# Patient Record
Sex: Male | Born: 1935
Health system: Southern US, Community
[De-identification: ages and names within clinical notes are randomized; demographics above are authoritative.]

## PROBLEM LIST (undated history)

## (undated) DIAGNOSIS — I10 Essential (primary) hypertension: Secondary | ICD-10-CM

## (undated) DIAGNOSIS — N400 Enlarged prostate without lower urinary tract symptoms: Secondary | ICD-10-CM

## (undated) DIAGNOSIS — R9431 Abnormal electrocardiogram [ECG] [EKG]: Secondary | ICD-10-CM

## (undated) DIAGNOSIS — K649 Unspecified hemorrhoids: Secondary | ICD-10-CM

## (undated) DIAGNOSIS — N471 Phimosis: Secondary | ICD-10-CM

## (undated) DIAGNOSIS — N529 Male erectile dysfunction, unspecified: Secondary | ICD-10-CM

## (undated) DIAGNOSIS — E559 Vitamin D deficiency, unspecified: Secondary | ICD-10-CM

## (undated) DIAGNOSIS — K635 Polyp of colon: Secondary | ICD-10-CM

## (undated) DIAGNOSIS — E039 Hypothyroidism, unspecified: Secondary | ICD-10-CM

## (undated) DIAGNOSIS — H409 Unspecified glaucoma: Secondary | ICD-10-CM

## (undated) DIAGNOSIS — E785 Hyperlipidemia, unspecified: Secondary | ICD-10-CM

## (undated) DIAGNOSIS — B029 Zoster without complications: Secondary | ICD-10-CM

## (undated) DIAGNOSIS — R7301 Impaired fasting glucose: Secondary | ICD-10-CM

## (undated) HISTORY — DX: Polyp of colon: K63.5

## (undated) HISTORY — PX: TONSILLECTOMY: SUR1361

## (undated) HISTORY — DX: Benign prostatic hyperplasia without lower urinary tract symptoms: N40.0

## (undated) HISTORY — PX: EYE SURGERY: SHX253

## (undated) HISTORY — DX: Essential (primary) hypertension: I10

## (undated) HISTORY — DX: Hypothyroidism, unspecified: E03.9

## (undated) HISTORY — DX: Zoster without complications: B02.9

## (undated) HISTORY — DX: Vitamin D deficiency, unspecified: E55.9

## (undated) HISTORY — DX: Impaired fasting glucose: R73.01

## (undated) HISTORY — DX: Unspecified hemorrhoids: K64.9

## (undated) HISTORY — DX: Hyperlipidemia, unspecified: E78.5

## (undated) HISTORY — DX: Male erectile dysfunction, unspecified: N52.9

---

## 1997-02-23 DIAGNOSIS — I10 Essential (primary) hypertension: Secondary | ICD-10-CM

## 1997-02-23 HISTORY — DX: Essential (primary) hypertension: I10

## 1999-02-24 DIAGNOSIS — E785 Hyperlipidemia, unspecified: Secondary | ICD-10-CM

## 1999-02-24 HISTORY — DX: Hyperlipidemia, unspecified: E78.5

## 2001-01-28 ENCOUNTER — Ambulatory Visit (HOSPITAL_COMMUNITY): Admission: RE | Admit: 2001-01-28 | Discharge: 2001-01-28 | Payer: Self-pay | Admitting: *Deleted

## 2001-01-28 ENCOUNTER — Encounter (INDEPENDENT_AMBULATORY_CARE_PROVIDER_SITE_OTHER): Payer: Self-pay | Admitting: Specialist

## 2003-02-24 DIAGNOSIS — R7301 Impaired fasting glucose: Secondary | ICD-10-CM

## 2003-02-24 HISTORY — DX: Impaired fasting glucose: R73.01

## 2004-02-24 DIAGNOSIS — B029 Zoster without complications: Secondary | ICD-10-CM

## 2004-02-24 DIAGNOSIS — K649 Unspecified hemorrhoids: Secondary | ICD-10-CM

## 2004-02-24 HISTORY — DX: Zoster without complications: B02.9

## 2004-02-24 HISTORY — DX: Unspecified hemorrhoids: K64.9

## 2007-01-28 ENCOUNTER — Encounter (INDEPENDENT_AMBULATORY_CARE_PROVIDER_SITE_OTHER): Payer: Self-pay | Admitting: Surgery

## 2007-01-28 ENCOUNTER — Ambulatory Visit (HOSPITAL_BASED_OUTPATIENT_CLINIC_OR_DEPARTMENT_OTHER): Admission: RE | Admit: 2007-01-28 | Discharge: 2007-01-28 | Payer: Self-pay | Admitting: Surgery

## 2009-08-24 ENCOUNTER — Emergency Department (HOSPITAL_COMMUNITY): Admission: EM | Admit: 2009-08-24 | Discharge: 2009-08-25 | Payer: Self-pay | Admitting: Emergency Medicine

## 2009-08-24 ENCOUNTER — Emergency Department (HOSPITAL_COMMUNITY): Admission: EM | Admit: 2009-08-24 | Discharge: 2009-08-24 | Payer: Self-pay | Admitting: Family Medicine

## 2010-05-08 ENCOUNTER — Other Ambulatory Visit: Payer: Self-pay | Admitting: Gastroenterology

## 2010-05-11 LAB — URINE CULTURE: Colony Count: NO GROWTH

## 2010-05-11 LAB — DIFFERENTIAL
Basophils Absolute: 0 10*3/uL (ref 0.0–0.1)
Lymphs Abs: 0.7 10*3/uL (ref 0.7–4.0)
Monocytes Absolute: 0.4 10*3/uL (ref 0.1–1.0)
Monocytes Relative: 11 % (ref 3–12)
Neutro Abs: 2.9 10*3/uL (ref 1.7–7.7)
Neutrophils Relative %: 72 % (ref 43–77)

## 2010-05-11 LAB — POCT URINALYSIS DIP (DEVICE)
Glucose, UA: NEGATIVE mg/dL
Nitrite: NEGATIVE
Protein, ur: 100 mg/dL — AB
Specific Gravity, Urine: 1.025 (ref 1.005–1.030)
Urobilinogen, UA: 1 mg/dL (ref 0.0–1.0)
pH: 5.5 (ref 5.0–8.0)

## 2010-05-11 LAB — GLUCOSE, CAPILLARY: Glucose-Capillary: 122 mg/dL — ABNORMAL HIGH (ref 70–99)

## 2010-05-11 LAB — BASIC METABOLIC PANEL
BUN: 30 mg/dL — ABNORMAL HIGH (ref 6–23)
Calcium: 9 mg/dL (ref 8.4–10.5)
GFR calc non Af Amer: 51 mL/min — ABNORMAL LOW (ref >60)
Potassium: 2.9 mEq/L — ABNORMAL LOW (ref 3.5–5.1)
Sodium: 137 mEq/L (ref 135–145)

## 2010-05-11 LAB — URINALYSIS, ROUTINE W REFLEX MICROSCOPIC
Glucose, UA: NEGATIVE mg/dL
Leukocytes, UA: NEGATIVE

## 2010-05-11 LAB — CULTURE, BLOOD (ROUTINE X 2)
Culture: NO GROWTH
Culture: NO GROWTH

## 2010-05-11 LAB — CBC
HCT: 43.3 % (ref 39.0–52.0)
MCH: 32 pg (ref 26.0–34.0)
RBC: 4.61 MIL/uL (ref 4.22–5.81)
RDW: 13.5 % (ref 11.5–15.5)
WBC: 4 10*3/uL (ref 4.0–10.5)

## 2010-05-11 LAB — LACTIC ACID, PLASMA: Lactic Acid, Venous: 1.1 mmol/L (ref 0.5–2.2)

## 2010-05-11 LAB — URINE MICROSCOPIC-ADD ON

## 2010-07-08 NOTE — Op Note (Signed)
Anthony Allison, Anthony Allison            ACCOUNT NO.:  [id number]   MEDICAL RECORD NO.:  [id number]          PATIENT TYPE:  AMB   LOCATION:  [facility]                         FACILITY:  [facility]   PHYSICIAN:  Anthony Arms. Corliss Skains, M.D. DATE OF BIRTH:  May 21, 1935   DATE OF PROCEDURE:  01/28/2007  DATE OF DISCHARGE:                               OPERATIVE REPORT   PREOPERATIVE DIAGNOSES:  Prolapsing internal hemorrhoids.   POSTOPERATIVE DIAGNOSES:  Prolapsing internal hemorrhoids.   PROCEDURE PERFORMED:  Stapled hemorrhoidopexy (PPH).   SURGEON:  Anthony Arms. Corliss Skains, M.D., FACS   ANESTHESIA:  General endotracheal.   INDICATIONS:  The patient is a 75 year old male who presents with a  several year history of hemorrhoids which seemed to be prolapsing.  He  occasionally sees some bleeding.  He has to manually reduce these  hemorrhoids.  His bowel movements have improved slightly with the use of  stool softeners.  However, he continues to have prolapse.  He presents  today for a PPH procedure.   DESCRIPTION OF PROCEDURE:  The patient was brought to the operating  room, placed in supine position on a stretcher.  After an adequate level  of general anesthesia was obtained, the patient was flipped to a prone  position with appropriate padding.  He was placed in jackknife position.  His buttocks were taped apart.  His perineum was prepped with Betadine  and draped in a sterile fashion.  A lubricated finger was used to  serially dilate his anus up to three fingers.  The [facility] kit was opened.  The white dilator was slowly inserted into the anal canal and held for  several minutes.  This was then removed and the dilator was used to  insert the white working retractor.  The working retractor was sutured  into place with two interrupted 2-0 nylon sutures.  The dilator was  removed and the white sewing retractor was inserted.  A pursestring  suture of 2-0 Prolene was used to create a pursestring suture at the and  of the white sewing retractor.  We created a circumferential  pursestring.  We confirmed this by pulling the pursestring tight around  my finger.  The sewing retractor was then removed.  The PPH stapler was  opened to its longest extent and the green anvil was inserted above the  pursestring.  The pursestring was then tied down around the anvil.  The  sutures were passed through the side ports of the stapler and an air  knot was tied.  Gentle retraction was placed on this air knot as the  stapler was closed.  The staple line appeared to be about 3 cm above the  dentate line.  We held the stapler in a closed position for about 1  minute.  We then fired the stapler.  An entire ring of hemorrhoid tissue  was seen.  This was removed from the stapler and sent for pathologic  examination.  We then inspected the staple line.  There was minimal  oozing.  This was controlled with direct pressure.  We irrigated the  rectum thoroughly.  No further bleeding was  noted. A large piece of  Gelfoam was rolled up and inserted into the  anal canal.  The retractors were all removed and an ABD dressing was  placed over the anus.  The patient was then rolled back to a supine  position, was extubated and brought to recovery in stable condition.  All sponge, instrument, and needle counts were correct.      Anthony Allison, M.D.  Electronically Signed     MKT/MEDQ  D:  01/28/2007  T:  01/28/2007  Job:  [id number]   cc:   Loraine Leriche A. Perini, M.D.  Fax: [phone]

## 2010-12-01 LAB — DIFFERENTIAL
Basophils Absolute: 0
Basophils Relative: 1
Eosinophils Absolute: 0.1 — ABNORMAL LOW
Lymphs Abs: 1.7
Monocytes Absolute: 0.6
Neutrophils Relative %: 57

## 2010-12-01 LAB — BASIC METABOLIC PANEL
BUN: 17
CO2: 31
Creatinine, Ser: 1.04
GFR calc non Af Amer: >60
Glucose, Bld: 97
Sodium: 140

## 2010-12-01 LAB — CBC
HCT: 46.1
Platelets: 147 — ABNORMAL LOW
RBC: 4.98
RDW: 13.1
WBC: 5.5

## 2011-06-30 IMAGING — CR DG CHEST 2V
2 series · 2 of 2 positions shown · non-contrast
Comparison: None.

CLINICAL DATA: Fever; history of dizziness.

CHEST - 2 VIEW

[w chest pa]
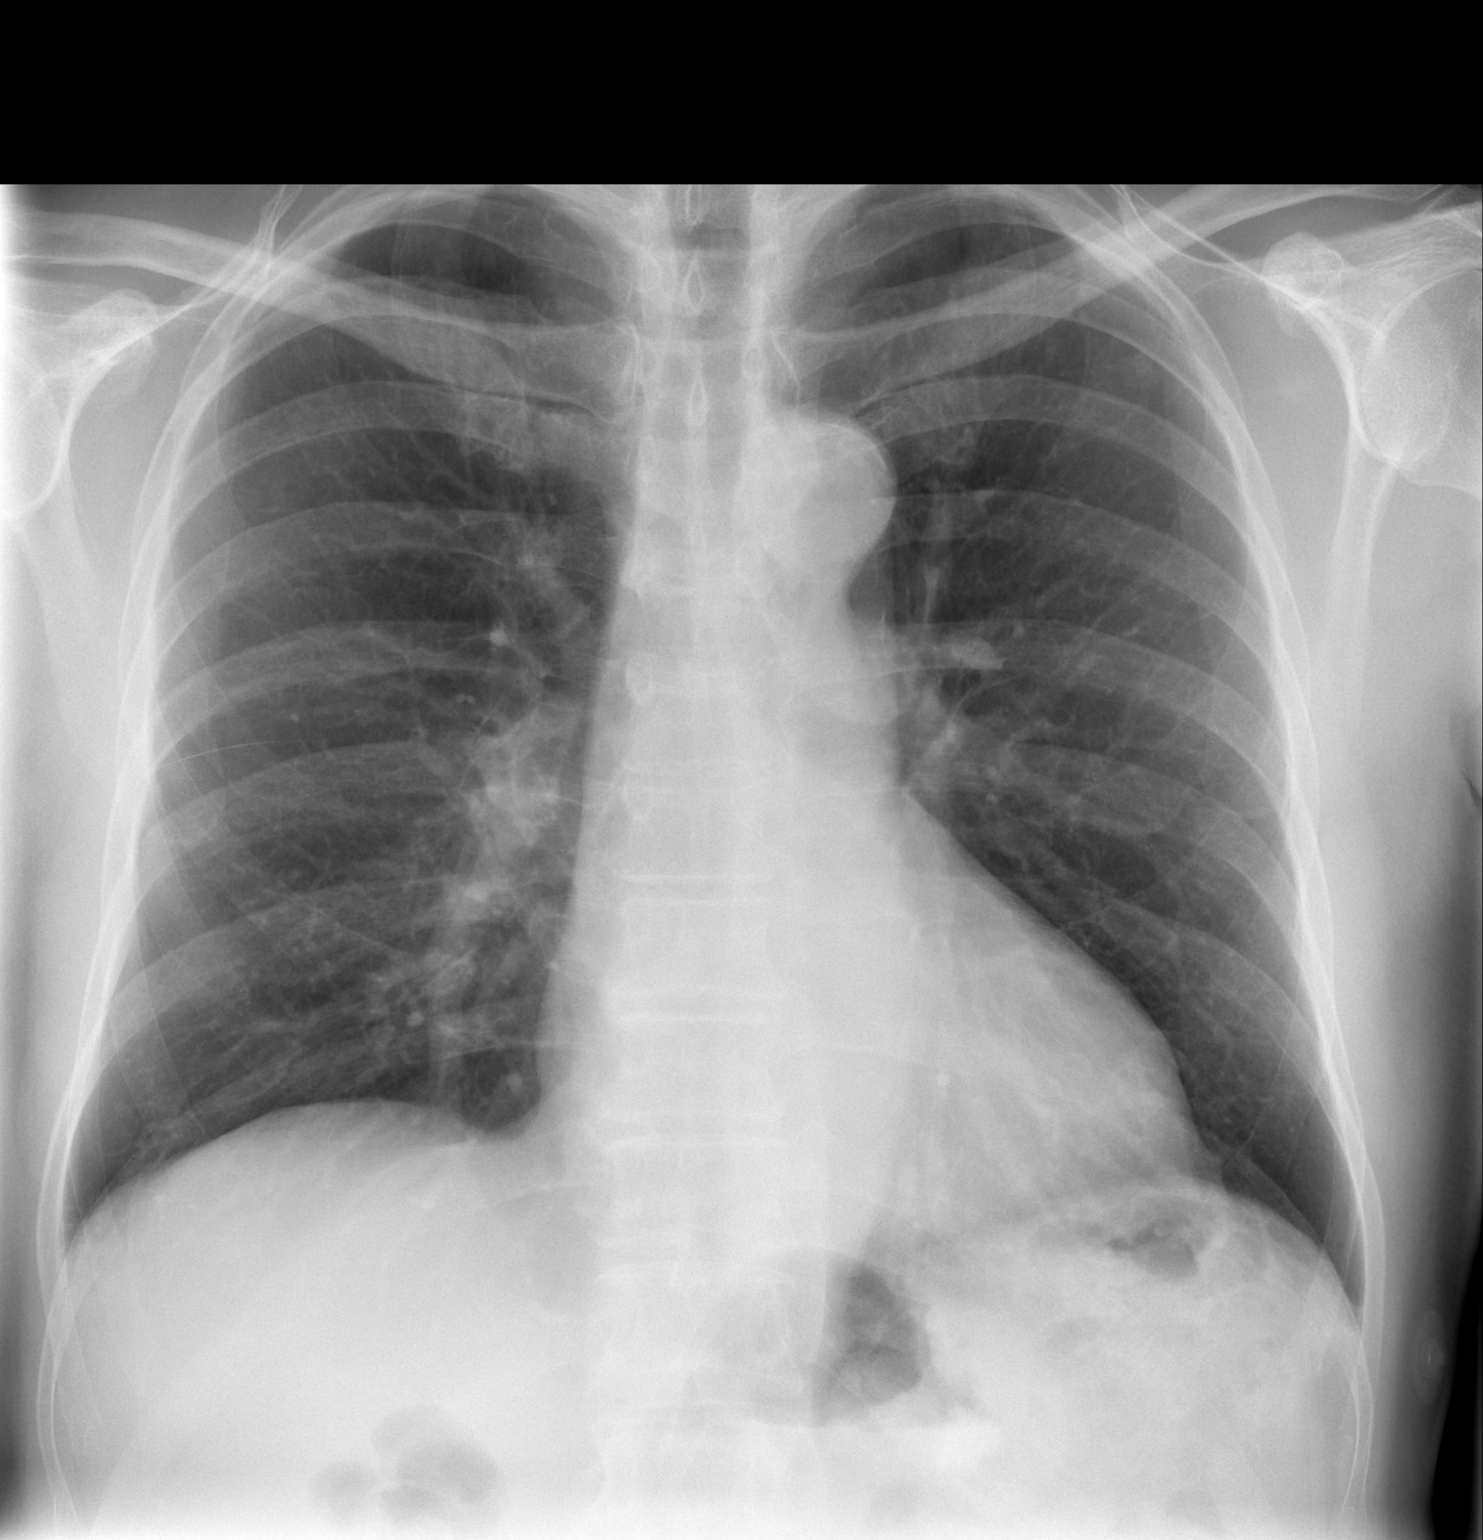

[w chest lat]
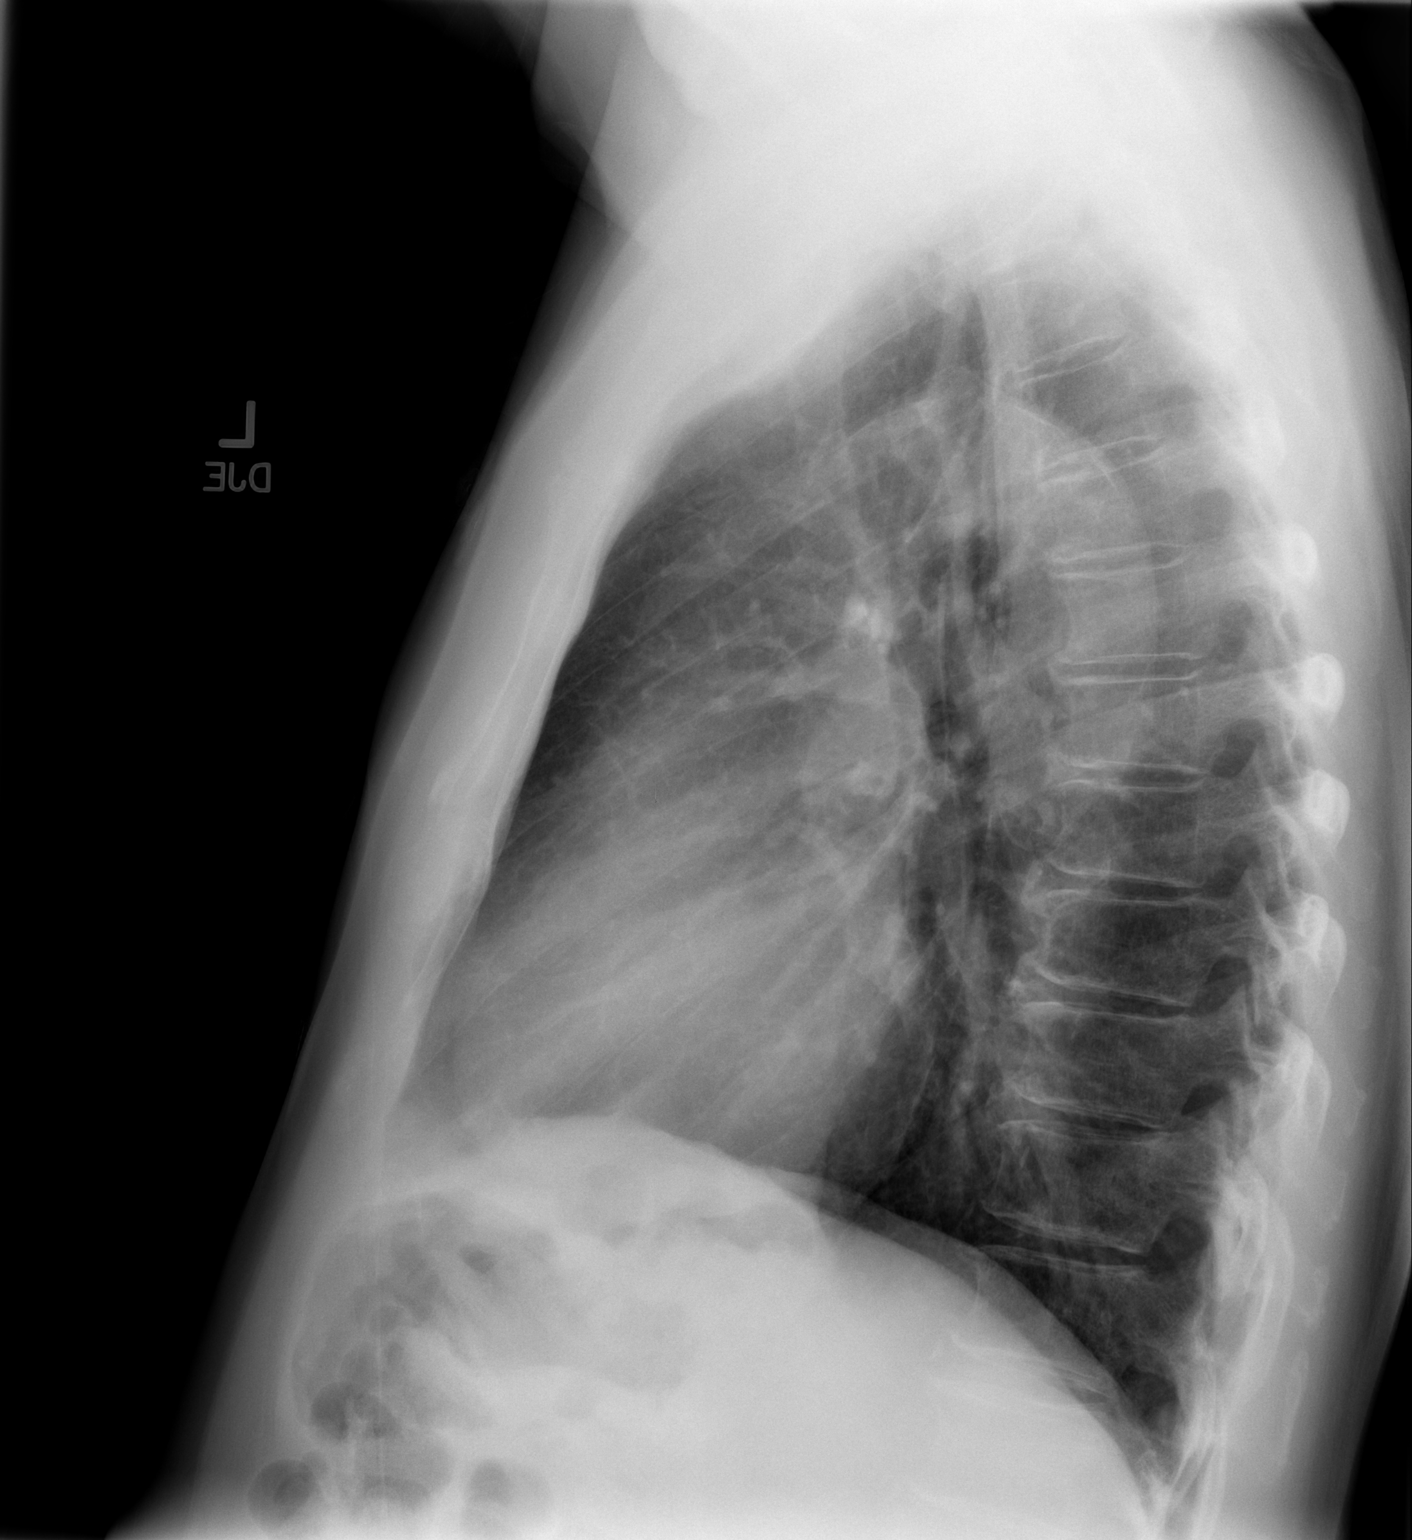

[2 of 2 positions shown; findings below may reference images not displayed]

FINDINGS: The lungs are well-aerated and clear.  There is no
evidence of focal opacification, pleural effusion or pneumothorax.

The heart is normal in size; calcification is noted within the
aortic arch.  No acute osseous abnormalities are seen.
IMPRESSION: No acute cardiopulmonary process seen.

## 2013-02-23 HISTORY — PX: GLAUCOMA SURGERY: SHX656

## 2014-03-13 DIAGNOSIS — H25012 Cortical age-related cataract, left eye: Secondary | ICD-10-CM | POA: Diagnosis not present

## 2014-03-13 DIAGNOSIS — H52202 Unspecified astigmatism, left eye: Secondary | ICD-10-CM | POA: Diagnosis not present

## 2014-03-13 DIAGNOSIS — H25812 Combined forms of age-related cataract, left eye: Secondary | ICD-10-CM | POA: Diagnosis not present

## 2014-03-13 DIAGNOSIS — H4010X0 Unspecified open-angle glaucoma, stage unspecified: Secondary | ICD-10-CM | POA: Diagnosis not present

## 2014-03-13 DIAGNOSIS — H2512 Age-related nuclear cataract, left eye: Secondary | ICD-10-CM | POA: Diagnosis not present

## 2014-03-13 DIAGNOSIS — H25042 Posterior subcapsular polar age-related cataract, left eye: Secondary | ICD-10-CM | POA: Diagnosis not present

## 2014-04-03 DIAGNOSIS — Z961 Presence of intraocular lens: Secondary | ICD-10-CM | POA: Diagnosis not present

## 2014-07-06 DIAGNOSIS — H401331 Pigmentary glaucoma, bilateral, mild stage: Secondary | ICD-10-CM | POA: Diagnosis not present

## 2014-07-06 DIAGNOSIS — Z961 Presence of intraocular lens: Secondary | ICD-10-CM | POA: Diagnosis not present

## 2014-07-10 DIAGNOSIS — Z85828 Personal history of other malignant neoplasm of skin: Secondary | ICD-10-CM | POA: Diagnosis not present

## 2014-07-10 DIAGNOSIS — L820 Inflamed seborrheic keratosis: Secondary | ICD-10-CM | POA: Diagnosis not present

## 2014-08-15 DIAGNOSIS — E785 Hyperlipidemia, unspecified: Secondary | ICD-10-CM | POA: Diagnosis not present

## 2014-08-15 DIAGNOSIS — E119 Type 2 diabetes mellitus without complications: Secondary | ICD-10-CM | POA: Diagnosis not present

## 2014-08-15 DIAGNOSIS — E559 Vitamin D deficiency, unspecified: Secondary | ICD-10-CM | POA: Diagnosis not present

## 2014-08-16 DIAGNOSIS — Z125 Encounter for screening for malignant neoplasm of prostate: Secondary | ICD-10-CM | POA: Diagnosis not present

## 2014-08-20 DIAGNOSIS — K635 Polyp of colon: Secondary | ICD-10-CM | POA: Diagnosis not present

## 2014-08-20 DIAGNOSIS — N529 Male erectile dysfunction, unspecified: Secondary | ICD-10-CM | POA: Diagnosis not present

## 2014-08-20 DIAGNOSIS — E119 Type 2 diabetes mellitus without complications: Secondary | ICD-10-CM | POA: Diagnosis not present

## 2014-08-20 DIAGNOSIS — N3281 Overactive bladder: Secondary | ICD-10-CM | POA: Diagnosis not present

## 2014-08-20 DIAGNOSIS — E785 Hyperlipidemia, unspecified: Secondary | ICD-10-CM | POA: Diagnosis not present

## 2014-08-20 DIAGNOSIS — E291 Testicular hypofunction: Secondary | ICD-10-CM | POA: Diagnosis not present

## 2014-08-20 DIAGNOSIS — E876 Hypokalemia: Secondary | ICD-10-CM | POA: Diagnosis not present

## 2014-08-20 DIAGNOSIS — Z0000 Encounter for general adult medical examination without abnormal findings: Secondary | ICD-10-CM | POA: Diagnosis not present

## 2014-08-23 DIAGNOSIS — Z961 Presence of intraocular lens: Secondary | ICD-10-CM | POA: Diagnosis not present

## 2014-08-23 DIAGNOSIS — H5340 Unspecified visual field defects: Secondary | ICD-10-CM | POA: Diagnosis not present

## 2014-08-23 DIAGNOSIS — H401331 Pigmentary glaucoma, bilateral, mild stage: Secondary | ICD-10-CM | POA: Diagnosis not present

## 2014-09-05 DIAGNOSIS — Z1212 Encounter for screening for malignant neoplasm of rectum: Secondary | ICD-10-CM | POA: Diagnosis not present

## 2014-12-06 DIAGNOSIS — Z23 Encounter for immunization: Secondary | ICD-10-CM | POA: Diagnosis not present

## 2014-12-06 DIAGNOSIS — E876 Hypokalemia: Secondary | ICD-10-CM | POA: Diagnosis not present

## 2015-01-25 DIAGNOSIS — Z961 Presence of intraocular lens: Secondary | ICD-10-CM | POA: Diagnosis not present

## 2015-01-25 DIAGNOSIS — H401321 Pigmentary glaucoma, left eye, mild stage: Secondary | ICD-10-CM | POA: Diagnosis not present

## 2015-01-25 DIAGNOSIS — H401311 Pigmentary glaucoma, right eye, mild stage: Secondary | ICD-10-CM | POA: Diagnosis not present

## 2015-06-13 DIAGNOSIS — H5340 Unspecified visual field defects: Secondary | ICD-10-CM | POA: Diagnosis not present

## 2015-06-13 DIAGNOSIS — H401112 Primary open-angle glaucoma, right eye, moderate stage: Secondary | ICD-10-CM | POA: Diagnosis not present

## 2015-06-13 DIAGNOSIS — H26491 Other secondary cataract, right eye: Secondary | ICD-10-CM | POA: Diagnosis not present

## 2015-06-13 DIAGNOSIS — H401122 Primary open-angle glaucoma, left eye, moderate stage: Secondary | ICD-10-CM | POA: Diagnosis not present

## 2015-11-07 DIAGNOSIS — Z961 Presence of intraocular lens: Secondary | ICD-10-CM | POA: Diagnosis not present

## 2015-11-07 DIAGNOSIS — H401332 Pigmentary glaucoma, bilateral, moderate stage: Secondary | ICD-10-CM | POA: Diagnosis not present

## 2015-11-26 DIAGNOSIS — Z23 Encounter for immunization: Secondary | ICD-10-CM | POA: Diagnosis not present

## 2016-01-08 DIAGNOSIS — H401332 Pigmentary glaucoma, bilateral, moderate stage: Secondary | ICD-10-CM | POA: Diagnosis not present

## 2016-01-08 DIAGNOSIS — Z961 Presence of intraocular lens: Secondary | ICD-10-CM | POA: Diagnosis not present

## 2016-01-24 DIAGNOSIS — E784 Other hyperlipidemia: Secondary | ICD-10-CM | POA: Diagnosis not present

## 2016-01-24 DIAGNOSIS — Z125 Encounter for screening for malignant neoplasm of prostate: Secondary | ICD-10-CM | POA: Diagnosis not present

## 2016-01-24 DIAGNOSIS — Z0000 Encounter for general adult medical examination without abnormal findings: Secondary | ICD-10-CM | POA: Diagnosis not present

## 2016-01-24 DIAGNOSIS — E559 Vitamin D deficiency, unspecified: Secondary | ICD-10-CM | POA: Diagnosis not present

## 2016-01-24 DIAGNOSIS — E119 Type 2 diabetes mellitus without complications: Secondary | ICD-10-CM | POA: Diagnosis not present

## 2016-01-28 DIAGNOSIS — E784 Other hyperlipidemia: Secondary | ICD-10-CM | POA: Diagnosis not present

## 2016-01-29 DIAGNOSIS — E876 Hypokalemia: Secondary | ICD-10-CM | POA: Diagnosis not present

## 2016-01-29 DIAGNOSIS — N3281 Overactive bladder: Secondary | ICD-10-CM | POA: Diagnosis not present

## 2016-01-29 DIAGNOSIS — Z0000 Encounter for general adult medical examination without abnormal findings: Secondary | ICD-10-CM | POA: Diagnosis not present

## 2016-01-29 DIAGNOSIS — I10 Essential (primary) hypertension: Secondary | ICD-10-CM | POA: Diagnosis not present

## 2016-01-29 DIAGNOSIS — H268 Other specified cataract: Secondary | ICD-10-CM | POA: Diagnosis not present

## 2016-01-29 DIAGNOSIS — N528 Other male erectile dysfunction: Secondary | ICD-10-CM | POA: Diagnosis not present

## 2016-01-29 DIAGNOSIS — Z87438 Personal history of other diseases of male genital organs: Secondary | ICD-10-CM | POA: Diagnosis not present

## 2016-01-29 DIAGNOSIS — E119 Type 2 diabetes mellitus without complications: Secondary | ICD-10-CM | POA: Diagnosis not present

## 2016-01-29 DIAGNOSIS — E784 Other hyperlipidemia: Secondary | ICD-10-CM | POA: Diagnosis not present

## 2016-02-12 DIAGNOSIS — Z961 Presence of intraocular lens: Secondary | ICD-10-CM | POA: Diagnosis not present

## 2016-02-12 DIAGNOSIS — H401332 Pigmentary glaucoma, bilateral, moderate stage: Secondary | ICD-10-CM | POA: Diagnosis not present

## 2016-02-12 DIAGNOSIS — D3141 Benign neoplasm of right ciliary body: Secondary | ICD-10-CM | POA: Diagnosis not present

## 2016-02-12 DIAGNOSIS — D3142 Benign neoplasm of left ciliary body: Secondary | ICD-10-CM | POA: Diagnosis not present

## 2016-05-13 DIAGNOSIS — D0439 Carcinoma in situ of skin of other parts of face: Secondary | ICD-10-CM | POA: Diagnosis not present

## 2016-05-13 DIAGNOSIS — L570 Actinic keratosis: Secondary | ICD-10-CM | POA: Diagnosis not present

## 2016-05-13 DIAGNOSIS — Z85828 Personal history of other malignant neoplasm of skin: Secondary | ICD-10-CM | POA: Diagnosis not present

## 2016-05-13 DIAGNOSIS — L821 Other seborrheic keratosis: Secondary | ICD-10-CM | POA: Diagnosis not present

## 2016-05-14 DIAGNOSIS — H401332 Pigmentary glaucoma, bilateral, moderate stage: Secondary | ICD-10-CM | POA: Diagnosis not present

## 2016-05-14 DIAGNOSIS — D3142 Benign neoplasm of left ciliary body: Secondary | ICD-10-CM | POA: Diagnosis not present

## 2016-05-14 DIAGNOSIS — Z961 Presence of intraocular lens: Secondary | ICD-10-CM | POA: Diagnosis not present

## 2016-06-08 DIAGNOSIS — D0439 Carcinoma in situ of skin of other parts of face: Secondary | ICD-10-CM | POA: Diagnosis not present

## 2016-06-08 DIAGNOSIS — Z85828 Personal history of other malignant neoplasm of skin: Secondary | ICD-10-CM | POA: Diagnosis not present

## 2016-06-08 DIAGNOSIS — L570 Actinic keratosis: Secondary | ICD-10-CM | POA: Diagnosis not present

## 2016-08-12 DIAGNOSIS — R0602 Shortness of breath: Secondary | ICD-10-CM | POA: Diagnosis not present

## 2016-08-12 DIAGNOSIS — Z6823 Body mass index (BMI) 23.0-23.9, adult: Secondary | ICD-10-CM | POA: Diagnosis not present

## 2016-08-12 DIAGNOSIS — M545 Low back pain: Secondary | ICD-10-CM | POA: Diagnosis not present

## 2016-08-12 DIAGNOSIS — R0609 Other forms of dyspnea: Secondary | ICD-10-CM | POA: Diagnosis not present

## 2016-09-02 DIAGNOSIS — H401332 Pigmentary glaucoma, bilateral, moderate stage: Secondary | ICD-10-CM | POA: Diagnosis not present

## 2016-09-14 DIAGNOSIS — H5340 Unspecified visual field defects: Secondary | ICD-10-CM | POA: Diagnosis not present

## 2016-09-14 DIAGNOSIS — Z961 Presence of intraocular lens: Secondary | ICD-10-CM | POA: Diagnosis not present

## 2016-09-14 DIAGNOSIS — H401332 Pigmentary glaucoma, bilateral, moderate stage: Secondary | ICD-10-CM | POA: Diagnosis not present

## 2016-09-28 ENCOUNTER — Encounter: Payer: Self-pay | Admitting: Cardiovascular Disease

## 2016-10-01 DIAGNOSIS — K640 First degree hemorrhoids: Secondary | ICD-10-CM | POA: Diagnosis not present

## 2016-10-01 DIAGNOSIS — Z86010 Personal history of colonic polyps: Secondary | ICD-10-CM | POA: Diagnosis not present

## 2016-10-14 ENCOUNTER — Encounter (INDEPENDENT_AMBULATORY_CARE_PROVIDER_SITE_OTHER): Payer: Self-pay

## 2016-10-14 ENCOUNTER — Encounter: Payer: Self-pay | Admitting: Cardiovascular Disease

## 2016-10-14 ENCOUNTER — Ambulatory Visit (INDEPENDENT_AMBULATORY_CARE_PROVIDER_SITE_OTHER): Payer: Medicare HMO | Admitting: Cardiovascular Disease

## 2016-10-14 VITALS — BP 118/78 | HR 54 | Ht 69.0 in | Wt 150.0 lb

## 2016-10-14 DIAGNOSIS — E782 Mixed hyperlipidemia: Secondary | ICD-10-CM | POA: Insufficient documentation

## 2016-10-14 DIAGNOSIS — I10 Essential (primary) hypertension: Secondary | ICD-10-CM | POA: Insufficient documentation

## 2016-10-14 DIAGNOSIS — R0609 Other forms of dyspnea: Secondary | ICD-10-CM | POA: Diagnosis not present

## 2016-10-14 NOTE — Patient Instructions (Signed)

## 2016-10-14 NOTE — Progress Notes (Signed)
Cardiology Office Note:    Date:  10/14/2016   ID:  PACE LAMADRID, DOB [birth year], MRN [id number]  PCP:  Crist Infante, MD  Cardiologist:  Mertie Moores, MD    Referring MD: No ref. provider found   Problem list 1. Hypertension 2. Hyperlipidemia 3. Right bundle branch block 4. Dyspnea on exertion  Chief Complaint  Patient presents with  . New Patient (Initial Visit)    some shortness of breath    History of Present Illness:    Anthony Allison is a 81 y.o. male with a hx of  HTN, hyperlipidemia ,  I saw Lewayne many years ago He recently went to a class reunion and talked to a classmate who had had a heart attack. He became concerned about some of his DOE Denies any CP .   Has some dyspnea when he mows the lawn - 45 min - 1 hr.  Push mower weed eats, blows grass .   Does not have to stop .   Previous pipe smoker - 20 years ago   Past Medical History:  Diagnosis Date  . Colon polyps    ADENOMA  . Erectile dysfunction of organic origin   . Hemorrhoids 2006   S/P PROCEDURE  . Hyperlipidemia 2001  . Hypertension 1999   ON MEDS  . Hypothyroidism   . Impaired fasting glucose 2005  . Prostatic hypertrophy    HX OF, BENIGN  . Shingles 2006   ON BACK  . Vitamin D deficiency     Past Surgical History:  Procedure Laterality Date  . EYE SURGERY Bilateral 2015 OR 2016   CATARACTS REMOVED  . GLAUCOMA SURGERY  2015    Current Medications: Current Meds  Medication Sig  . amLODipine (NORVASC) 5 MG tablet Take 5 mg by mouth daily.  [name] lisinopril (PRINIVIL,ZESTRIL) 40 MG tablet Take 40 mg by mouth daily.  . Omega-3 Fatty Acids (FISH OIL) 1000 MG CAPS Take 2 capsules by mouth daily.  . simvastatin (ZOCOR) 80 MG tablet Take 20 mg by mouth daily. TAKE 1/4 TABLET BY MOUTH DAILY IN EVENING.   . Travoprost, BAK Free, (TRAVATAN) 0.004 % SOLN ophthalmic solution Place 1 drop into both eyes at bedtime.      Allergies:   Patient has no known allergies.   Social History    Social History  . Marital status: Married    Spouse name: N/A  . Number of children: 3  . Years of education: N/A   Occupational History  . ARCHITECT    Social History Main Topics  . Smoking status: Former Research scientist (life sciences)  . Smokeless tobacco: Never Used  . Alcohol use Yes  . Drug use: No  . Sexual activity: Not Asked   Other Topics Concern  . None   Social History Narrative  . None     Family History: The patient's family history includes CAD in his father; CVA in his mother; Stroke in his mother. ROS:   Please see the history of present illness.     All other systems reviewed and are negative.  EKGs/Labs/Other Studies Reviewed:    The following studies were reviewed today:   EKG:    Recent Labs: No results found for requested labs within last 8760 hours.  Recent Lipid Panel No results found for: CHOL, TRIG, HDL, CHOLHDL, VLDL, LDLCALC, LDLDIRECT  Physical Exam:    VS:  BP 118/78   Pulse (!) 54   Ht 5\' 9"  (1.753 m)   Wt 150  lb (68 kg)   BMI 22.15 kg/m     Wt Readings from Last 3 Encounters:  10/14/16 150 lb (68 kg)     GEN:  Well nourished, well developed in no acute distress HEENT: Normal NECK: No JVD; No carotid bruits LYMPHATICS: No lymphadenopathy CARDIAC: RRR, no murmurs, rubs, gallops RESPIRATORY:  Clear to auscultation without rales, wheezing or rhonchi  ABDOMEN: Soft, non-tender, non-distended MUSCULOSKELETAL:  No edema; No deformity  SKIN: Warm and dry NEUROLOGIC:  Alert and oriented x 3 PSYCHIATRIC:  Normal affect   ASSESSMENT:    1. DOE (dyspnea on exertion)   2. Essential hypertension   3. Mixed hyperlipidemia    PLAN:    In order of problems listed above:  1. Hypertension - seems to be well controlled Continue meds  2. Hyperlipidemia - on simvastatin,   Managed by Dr. Joylene Draft   3. Right bundle branch block - stable   4. Dyspnea on exertion - seems to be physiologic.  I've reassured him that his symptoms sound normal     Medication Adjustments/Labs and Tests Ordered: Current medicines are reviewed at length with the patient today.  Concerns regarding medicines are outlined above.  Orders Placed This Encounter  Procedures  . EKG 12-Lead   No orders of the defined types were placed in this encounter.   Signed, Mertie Moores, MD  10/14/2016 5:48 PM    [place]

## 2016-12-09 DIAGNOSIS — D2261 Melanocytic nevi of right upper limb, including shoulder: Secondary | ICD-10-CM | POA: Diagnosis not present

## 2016-12-09 DIAGNOSIS — D0359 Melanoma in situ of other part of trunk: Secondary | ICD-10-CM | POA: Diagnosis not present

## 2016-12-09 DIAGNOSIS — D2272 Melanocytic nevi of left lower limb, including hip: Secondary | ICD-10-CM | POA: Diagnosis not present

## 2016-12-09 DIAGNOSIS — D2262 Melanocytic nevi of left upper limb, including shoulder: Secondary | ICD-10-CM | POA: Diagnosis not present

## 2016-12-09 DIAGNOSIS — D485 Neoplasm of uncertain behavior of skin: Secondary | ICD-10-CM | POA: Diagnosis not present

## 2016-12-09 DIAGNOSIS — L570 Actinic keratosis: Secondary | ICD-10-CM | POA: Diagnosis not present

## 2016-12-09 DIAGNOSIS — D225 Melanocytic nevi of trunk: Secondary | ICD-10-CM | POA: Diagnosis not present

## 2016-12-09 DIAGNOSIS — D1801 Hemangioma of skin and subcutaneous tissue: Secondary | ICD-10-CM | POA: Diagnosis not present

## 2016-12-09 DIAGNOSIS — L821 Other seborrheic keratosis: Secondary | ICD-10-CM | POA: Diagnosis not present

## 2016-12-09 DIAGNOSIS — Z85828 Personal history of other malignant neoplasm of skin: Secondary | ICD-10-CM | POA: Diagnosis not present

## 2017-01-05 DIAGNOSIS — Z85828 Personal history of other malignant neoplasm of skin: Secondary | ICD-10-CM | POA: Diagnosis not present

## 2017-01-05 DIAGNOSIS — D0362 Melanoma in situ of left upper limb, including shoulder: Secondary | ICD-10-CM | POA: Diagnosis not present

## 2017-02-19 DIAGNOSIS — D3142 Benign neoplasm of left ciliary body: Secondary | ICD-10-CM | POA: Diagnosis not present

## 2017-02-19 DIAGNOSIS — H401332 Pigmentary glaucoma, bilateral, moderate stage: Secondary | ICD-10-CM | POA: Diagnosis not present

## 2017-02-19 DIAGNOSIS — Z961 Presence of intraocular lens: Secondary | ICD-10-CM | POA: Diagnosis not present

## 2017-04-26 DIAGNOSIS — Z13820 Encounter for screening for osteoporosis: Secondary | ICD-10-CM | POA: Diagnosis not present

## 2017-04-26 DIAGNOSIS — R82998 Other abnormal findings in urine: Secondary | ICD-10-CM | POA: Diagnosis not present

## 2017-04-26 DIAGNOSIS — E7849 Other hyperlipidemia: Secondary | ICD-10-CM | POA: Diagnosis not present

## 2017-04-26 DIAGNOSIS — Z125 Encounter for screening for malignant neoplasm of prostate: Secondary | ICD-10-CM | POA: Diagnosis not present

## 2017-04-26 DIAGNOSIS — M859 Disorder of bone density and structure, unspecified: Secondary | ICD-10-CM | POA: Diagnosis not present

## 2017-04-26 DIAGNOSIS — E559 Vitamin D deficiency, unspecified: Secondary | ICD-10-CM | POA: Diagnosis not present

## 2017-04-26 DIAGNOSIS — E119 Type 2 diabetes mellitus without complications: Secondary | ICD-10-CM | POA: Diagnosis not present

## 2017-04-26 DIAGNOSIS — I10 Essential (primary) hypertension: Secondary | ICD-10-CM | POA: Diagnosis not present

## 2017-05-03 DIAGNOSIS — N3281 Overactive bladder: Secondary | ICD-10-CM | POA: Diagnosis not present

## 2017-05-03 DIAGNOSIS — Z0000 Encounter for general adult medical examination without abnormal findings: Secondary | ICD-10-CM | POA: Diagnosis not present

## 2017-05-03 DIAGNOSIS — M545 Low back pain: Secondary | ICD-10-CM | POA: Diagnosis not present

## 2017-05-03 DIAGNOSIS — E298 Other testicular dysfunction: Secondary | ICD-10-CM | POA: Diagnosis not present

## 2017-05-03 DIAGNOSIS — E7849 Other hyperlipidemia: Secondary | ICD-10-CM | POA: Diagnosis not present

## 2017-05-03 DIAGNOSIS — R0609 Other forms of dyspnea: Secondary | ICD-10-CM | POA: Diagnosis not present

## 2017-05-03 DIAGNOSIS — H268 Other specified cataract: Secondary | ICD-10-CM | POA: Diagnosis not present

## 2017-05-03 DIAGNOSIS — E119 Type 2 diabetes mellitus without complications: Secondary | ICD-10-CM | POA: Diagnosis not present

## 2017-05-03 DIAGNOSIS — E876 Hypokalemia: Secondary | ICD-10-CM | POA: Diagnosis not present

## 2017-05-04 DIAGNOSIS — Z1212 Encounter for screening for malignant neoplasm of rectum: Secondary | ICD-10-CM | POA: Diagnosis not present

## 2017-06-18 DIAGNOSIS — L821 Other seborrheic keratosis: Secondary | ICD-10-CM | POA: Diagnosis not present

## 2017-06-18 DIAGNOSIS — D692 Other nonthrombocytopenic purpura: Secondary | ICD-10-CM | POA: Diagnosis not present

## 2017-06-18 DIAGNOSIS — D485 Neoplasm of uncertain behavior of skin: Secondary | ICD-10-CM | POA: Diagnosis not present

## 2017-06-18 DIAGNOSIS — Z85820 Personal history of malignant melanoma of skin: Secondary | ICD-10-CM | POA: Diagnosis not present

## 2017-06-18 DIAGNOSIS — D2271 Melanocytic nevi of right lower limb, including hip: Secondary | ICD-10-CM | POA: Diagnosis not present

## 2017-06-18 DIAGNOSIS — L570 Actinic keratosis: Secondary | ICD-10-CM | POA: Diagnosis not present

## 2017-06-18 DIAGNOSIS — D225 Melanocytic nevi of trunk: Secondary | ICD-10-CM | POA: Diagnosis not present

## 2017-06-18 DIAGNOSIS — H401332 Pigmentary glaucoma, bilateral, moderate stage: Secondary | ICD-10-CM | POA: Diagnosis not present

## 2017-06-18 DIAGNOSIS — Z961 Presence of intraocular lens: Secondary | ICD-10-CM | POA: Diagnosis not present

## 2017-06-18 DIAGNOSIS — D1801 Hemangioma of skin and subcutaneous tissue: Secondary | ICD-10-CM | POA: Diagnosis not present

## 2017-06-18 DIAGNOSIS — Z85828 Personal history of other malignant neoplasm of skin: Secondary | ICD-10-CM | POA: Diagnosis not present

## 2017-12-20 DIAGNOSIS — L570 Actinic keratosis: Secondary | ICD-10-CM | POA: Diagnosis not present

## 2017-12-20 DIAGNOSIS — L821 Other seborrheic keratosis: Secondary | ICD-10-CM | POA: Diagnosis not present

## 2017-12-20 DIAGNOSIS — Z85820 Personal history of malignant melanoma of skin: Secondary | ICD-10-CM | POA: Diagnosis not present

## 2017-12-20 DIAGNOSIS — L820 Inflamed seborrheic keratosis: Secondary | ICD-10-CM | POA: Diagnosis not present

## 2017-12-20 DIAGNOSIS — D225 Melanocytic nevi of trunk: Secondary | ICD-10-CM | POA: Diagnosis not present

## 2017-12-20 DIAGNOSIS — L814 Other melanin hyperpigmentation: Secondary | ICD-10-CM | POA: Diagnosis not present

## 2017-12-20 DIAGNOSIS — C44629 Squamous cell carcinoma of skin of left upper limb, including shoulder: Secondary | ICD-10-CM | POA: Diagnosis not present

## 2017-12-20 DIAGNOSIS — Z85828 Personal history of other malignant neoplasm of skin: Secondary | ICD-10-CM | POA: Diagnosis not present

## 2017-12-20 DIAGNOSIS — D485 Neoplasm of uncertain behavior of skin: Secondary | ICD-10-CM | POA: Diagnosis not present

## 2018-01-26 DIAGNOSIS — H52203 Unspecified astigmatism, bilateral: Secondary | ICD-10-CM | POA: Diagnosis not present

## 2018-01-26 DIAGNOSIS — H401332 Pigmentary glaucoma, bilateral, moderate stage: Secondary | ICD-10-CM | POA: Diagnosis not present

## 2018-01-26 DIAGNOSIS — H5340 Unspecified visual field defects: Secondary | ICD-10-CM | POA: Diagnosis not present

## 2018-01-26 DIAGNOSIS — H43813 Vitreous degeneration, bilateral: Secondary | ICD-10-CM | POA: Diagnosis not present

## 2018-06-09 DIAGNOSIS — Z125 Encounter for screening for malignant neoplasm of prostate: Secondary | ICD-10-CM | POA: Diagnosis not present

## 2018-06-09 DIAGNOSIS — E559 Vitamin D deficiency, unspecified: Secondary | ICD-10-CM | POA: Diagnosis not present

## 2018-06-09 DIAGNOSIS — E7849 Other hyperlipidemia: Secondary | ICD-10-CM | POA: Diagnosis not present

## 2018-06-09 DIAGNOSIS — I10 Essential (primary) hypertension: Secondary | ICD-10-CM | POA: Diagnosis not present

## 2018-06-09 DIAGNOSIS — E119 Type 2 diabetes mellitus without complications: Secondary | ICD-10-CM | POA: Diagnosis not present

## 2018-06-14 DIAGNOSIS — I10 Essential (primary) hypertension: Secondary | ICD-10-CM | POA: Diagnosis not present

## 2018-06-14 DIAGNOSIS — R82998 Other abnormal findings in urine: Secondary | ICD-10-CM | POA: Diagnosis not present

## 2018-06-16 DIAGNOSIS — D126 Benign neoplasm of colon, unspecified: Secondary | ICD-10-CM | POA: Diagnosis not present

## 2018-06-16 DIAGNOSIS — R5383 Other fatigue: Secondary | ICD-10-CM | POA: Diagnosis not present

## 2018-06-16 DIAGNOSIS — M8580 Other specified disorders of bone density and structure, unspecified site: Secondary | ICD-10-CM | POA: Diagnosis not present

## 2018-06-16 DIAGNOSIS — Z0000 Encounter for general adult medical examination without abnormal findings: Secondary | ICD-10-CM | POA: Diagnosis not present

## 2018-06-16 DIAGNOSIS — R0609 Other forms of dyspnea: Secondary | ICD-10-CM | POA: Diagnosis not present

## 2018-06-16 DIAGNOSIS — M545 Low back pain: Secondary | ICD-10-CM | POA: Diagnosis not present

## 2018-06-16 DIAGNOSIS — N3281 Overactive bladder: Secondary | ICD-10-CM | POA: Diagnosis not present

## 2018-06-16 DIAGNOSIS — E538 Deficiency of other specified B group vitamins: Secondary | ICD-10-CM | POA: Diagnosis not present

## 2018-06-16 DIAGNOSIS — E119 Type 2 diabetes mellitus without complications: Secondary | ICD-10-CM | POA: Diagnosis not present

## 2018-06-16 DIAGNOSIS — E291 Testicular hypofunction: Secondary | ICD-10-CM | POA: Diagnosis not present

## 2018-06-16 DIAGNOSIS — C439 Malignant melanoma of skin, unspecified: Secondary | ICD-10-CM | POA: Diagnosis not present

## 2018-07-25 DIAGNOSIS — H401332 Pigmentary glaucoma, bilateral, moderate stage: Secondary | ICD-10-CM | POA: Diagnosis not present

## 2018-07-25 DIAGNOSIS — Z961 Presence of intraocular lens: Secondary | ICD-10-CM | POA: Diagnosis not present

## 2018-08-05 DIAGNOSIS — D225 Melanocytic nevi of trunk: Secondary | ICD-10-CM | POA: Diagnosis not present

## 2018-08-05 DIAGNOSIS — Z85820 Personal history of malignant melanoma of skin: Secondary | ICD-10-CM | POA: Diagnosis not present

## 2018-08-05 DIAGNOSIS — L570 Actinic keratosis: Secondary | ICD-10-CM | POA: Diagnosis not present

## 2018-08-05 DIAGNOSIS — D1801 Hemangioma of skin and subcutaneous tissue: Secondary | ICD-10-CM | POA: Diagnosis not present

## 2018-08-05 DIAGNOSIS — Z85828 Personal history of other malignant neoplasm of skin: Secondary | ICD-10-CM | POA: Diagnosis not present

## 2018-08-05 DIAGNOSIS — D0462 Carcinoma in situ of skin of left upper limb, including shoulder: Secondary | ICD-10-CM | POA: Diagnosis not present

## 2018-08-05 DIAGNOSIS — L814 Other melanin hyperpigmentation: Secondary | ICD-10-CM | POA: Diagnosis not present

## 2018-08-05 DIAGNOSIS — L821 Other seborrheic keratosis: Secondary | ICD-10-CM | POA: Diagnosis not present

## 2019-01-05 DIAGNOSIS — Z23 Encounter for immunization: Secondary | ICD-10-CM | POA: Diagnosis not present

## 2019-01-25 DIAGNOSIS — H401332 Pigmentary glaucoma, bilateral, moderate stage: Secondary | ICD-10-CM | POA: Diagnosis not present

## 2019-01-25 DIAGNOSIS — Z961 Presence of intraocular lens: Secondary | ICD-10-CM | POA: Diagnosis not present

## 2019-01-25 DIAGNOSIS — H43813 Vitreous degeneration, bilateral: Secondary | ICD-10-CM | POA: Diagnosis not present

## 2019-02-06 DIAGNOSIS — D1801 Hemangioma of skin and subcutaneous tissue: Secondary | ICD-10-CM | POA: Diagnosis not present

## 2019-02-06 DIAGNOSIS — D485 Neoplasm of uncertain behavior of skin: Secondary | ICD-10-CM | POA: Diagnosis not present

## 2019-02-06 DIAGNOSIS — L821 Other seborrheic keratosis: Secondary | ICD-10-CM | POA: Diagnosis not present

## 2019-02-06 DIAGNOSIS — Z85828 Personal history of other malignant neoplasm of skin: Secondary | ICD-10-CM | POA: Diagnosis not present

## 2019-02-06 DIAGNOSIS — Z85820 Personal history of malignant melanoma of skin: Secondary | ICD-10-CM | POA: Diagnosis not present

## 2019-02-06 DIAGNOSIS — L570 Actinic keratosis: Secondary | ICD-10-CM | POA: Diagnosis not present

## 2019-02-08 ENCOUNTER — Encounter: Payer: Self-pay | Admitting: Cardiovascular Disease

## 2019-02-08 ENCOUNTER — Telehealth: Payer: Self-pay | Admitting: Nurse Practitioner

## 2019-02-08 ENCOUNTER — Other Ambulatory Visit: Payer: Self-pay

## 2019-02-08 ENCOUNTER — Telehealth (INDEPENDENT_AMBULATORY_CARE_PROVIDER_SITE_OTHER): Payer: Medicare HMO | Admitting: Cardiovascular Disease

## 2019-02-08 VITALS — BP 130/79 | HR 60 | Ht 69.0 in

## 2019-02-08 DIAGNOSIS — R0609 Other forms of dyspnea: Secondary | ICD-10-CM

## 2019-02-08 DIAGNOSIS — I10 Essential (primary) hypertension: Secondary | ICD-10-CM | POA: Diagnosis not present

## 2019-02-08 DIAGNOSIS — E782 Mixed hyperlipidemia: Secondary | ICD-10-CM

## 2019-02-08 NOTE — Telephone Encounter (Signed)
YOUR CARDIOLOGY TEAM HAS ARRANGED FOR AN E-VISIT FOR YOUR APPOINTMENT - PLEASE REVIEW IMPORTANT INFORMATION BELOW SEVERAL DAYS PRIOR TO YOUR APPOINTMENT  Due to the recent COVID-19 pandemic, we are transitioning in-person office visits to tele-medicine visits in an effort to decrease unnecessary exposure to our patients, their families, and staff. These visits are billed to your insurance just like a normal visit is. We also encourage you to sign up for MyChart if you have not already done so. You will need a smartphone if possible. For patients that do not have this, we can still complete the visit using a regular telephone but do prefer a smartphone to enable video when possible. You may have a family member that lives with you that can help. If possible, we also ask that you have a blood pressure cuff and scale at home to measure your blood pressure, heart rate and weight prior to your scheduled appointment. Patients with clinical needs that need an in-person evaluation and testing will still be able to come to the office if absolutely necessary. If you have any questions, feel free to call our office.     YOUR PROVIDER WILL BE USING THE FOLLOWING PLATFORM TO COMPLETE YOUR VISIT: Staff: Please delete this text and fill in MyChart/Doximity/Doxy.Me  . IF USING MYCHART - How to Download the MyChart App to Your SmartPhone   - If Apple, go to App Store and type in MyChart in the search bar and download the app. If Android, ask patient to go to Google Play Store and type in MyChart in the search bar and download the app. The app is free but as with any other app downloads, your phone may require you to verify saved payment information or Apple/Android password.  - You will need to then log into the app with your MyChart username and password, and select [facility] as your healthcare provider to link the account.  - When it is time for your visit, go to the MyChart app, find appointments, and click Begin  Video Visit. Be sure to Select Allow for your device to access the Microphone and Camera for your visit. You will then be connected, and your provider will be with you shortly.  **If you have any issues connecting or need assistance, please contact MyChart service desk [phone] ([phone])**  **If using a computer, in order to ensure the best quality for your visit, you will need to use either of the following Internet Browsers: Google Chrome or Microsoft Edge**  . IF USING DOXIMITY or DOXY.ME - The staff will give you instructions on receiving your link to join the meeting the day of your visit.      THE DAY OF YOUR APPOINTMENT  Approximately 15 minutes prior to your scheduled appointment, you will receive a telephone call from one of [facility] team - your caller ID may say "Unknown caller."  Our staff will confirm medications, vital signs for the day and any symptoms you may be experiencing. Please have this information available prior to the time of visit start. It may also be helpful for you to have a pad of paper and pen handy for any instructions given during your visit. They will also walk you through joining the smartphone meeting if this is a video visit.    CONSENT FOR TELE-HEALTH VISIT - PLEASE REVIEW  I hereby voluntarily request, consent and authorize [facility] and its employed or contracted physicians, physician assistants, nurse practitioners or other licensed health care professionals (the   Practitioner), to provide me with telemedicine health care services (the "Services") as deemed necessary by the treating Practitioner. I acknowledge and consent to receive the Services by the Practitioner via telemedicine. I understand that the telemedicine visit will involve communicating with the Practitioner through live audiovisual communication technology and the disclosure of certain medical information by electronic transmission. I acknowledge that I have been given the  opportunity to request an in-person assessment or other available alternative prior to the telemedicine visit and am voluntarily participating in the telemedicine visit.  I understand that I have the right to withhold or withdraw my consent to the use of telemedicine in the course of my care at any time, without affecting my right to future care or treatment, and that the Practitioner or I may terminate the telemedicine visit at any time. I understand that I have the right to inspect all information obtained and/or recorded in the course of the telemedicine visit and may receive copies of available information for a reasonable fee.  I understand that some of the potential risks of receiving the Services via telemedicine include:  [name] Delay or interruption in medical evaluation due to technological equipment failure or disruption; . Information transmitted may not be sufficient (e.g. poor resolution of images) to allow for appropriate medical decision making by the Practitioner; and/or  . In rare instances, security protocols could fail, causing a breach of personal health information.  Furthermore, I acknowledge that it is my responsibility to provide information about my medical history, conditions and care that is complete and accurate to the best of my ability. I acknowledge that Practitioner's advice, recommendations, and/or decision may be based on factors not within their control, such as incomplete or inaccurate data provided by me or distortions of diagnostic images or specimens that may result from electronic transmissions. I understand that the practice of medicine is not an exact science and that Practitioner makes no warranties or guarantees regarding treatment outcomes. I acknowledge that I will receive a copy of this consent concurrently upon execution via email to the email address I last provided but may also request a printed copy by calling the office of [facility].    I understand that  my insurance will be billed for this visit.   I have read or had this consent read to me. . I understand the contents of this consent, which adequately explains the benefits and risks of the Services being provided via telemedicine.  . I have been provided ample opportunity to ask questions regarding this consent and the Services and have had my questions answered to my satisfaction. . I give my informed consent for the services to be provided through the use of telemedicine in my medical care  By participating in this telemedicine visit I agree to the above.  YOUR CARDIOLOGY TEAM HAS ARRANGED FOR AN E-VISIT FOR YOUR APPOINTMENT - PLEASE REVIEW IMPORTANT INFORMATION BELOW SEVERAL DAYS PRIOR TO YOUR APPOINTMENT  Due to the recent COVID-19 pandemic, we are transitioning in-person office visits to tele-medicine visits in an effort to decrease unnecessary exposure to our patients, their families, and staff. These visits are billed to your insurance just like a normal visit is. We also encourage you to sign up for MyChart if you have not already done so. You will need a smartphone if possible. For patients that do not have this, we can still complete the visit using a regular telephone but do prefer a smartphone to enable video when possible. You may have  a family member that lives with you that can help. If possible, we also ask that you have a blood pressure cuff and scale at home to measure your blood pressure, heart rate and weight prior to your scheduled appointment. Patients with clinical needs that need an in-person evaluation and testing will still be able to come to the office if absolutely necessary. If you have any questions, feel free to call our office.     YOUR PROVIDER WILL BE USING THE FOLLOWING PLATFORM TO COMPLETE YOUR VISIT: Staff: Please delete this text and fill in MyChart/Doximity/Doxy.Me  . IF USING MYCHART - How to Download the MyChart App to Your SmartPhone   - If Apple, go to  [employer] and type in MyChart in the search bar and download the app. If Android, ask patient to go to [employer] and type in [place] in the search bar and download the app. The app is free but as with any other app downloads, your phone may require you to verify saved payment information or Apple/Android password.  - You will need to then log into the app with your MyChart username and password, and select  as your healthcare provider to link the account.  - When it is time for your visit, go to the MyChart app, find appointments, and click Begin Video Visit. Be sure to Select Allow for your device to access the Microphone and Camera for your visit. You will then be connected, and your provider will be with you shortly.  **If you have any issues connecting or need assistance, please contact MyChart service desk [phone])**  **If using a computer, in order to ensure the best quality for your visit, you will need to use either of the following Internet Browsers: Insurance underwriter or [employer]**  . IF USING DOXIMITY or DOXY.ME - The staff will give you instructions on receiving your link to join the meeting the day of your visit.

## 2019-02-08 NOTE — Progress Notes (Signed)
Virtual Visit via Telephone Note   This visit type was conducted due to national recommendations for restrictions regarding the COVID-19 Pandemic (e.g. social distancing) in an effort to limit this patient's exposure and mitigate transmission in our community.  Due to his co-morbid illnesses, this patient is at least at moderate risk for complications without adequate follow up.  This format is felt to be most appropriate for this patient at this time.  The patient did not have access to video technology/had technical difficulties with video requiring transitioning to audio format only (telephone).  All issues noted in this document were discussed and addressed.  No physical exam could be performed with this format.  Please refer to the patient's chart for his  consent to telehealth for [facility].   Date:  02/08/2019   ID:  Anthony Allison, DOB 1935-12-05, MRN [id number]  Patient Location: Home Provider Location: Office  PCP:  Crist Infante, MD  Cardiologist:   Ece Cumberland  Electrophysiologist:  None   Previous Notes:  Problem list 1. Hypertension 2. Hyperlipidemia 3. Right bundle branch block 4. Dyspnea on exertion      Chief Complaint  Patient presents with  . New Patient (Initial Visit)    some shortness of breath    History of Present Illness:    Anthony Allison is a 83 y.o. male with a hx of  HTN, hyperlipidemia ,  I saw Anthony Allison many years ago He recently went to a class reunion and talked to a classmate who had had a heart attack. He became concerned about some of his DOE Denies any CP .   Has some dyspnea when he mows the lawn - 45 min - 1 hr.  Push mower weed eats, blows grass .   Does not have to stop .   Previous pipe smoker - 20 years ago    Evaluation Performed:  Follow-Up Visit  Chief Complaint:  HTN, hyperlipidemia   Dec. 16, 2020   Anthony Allison is a 83 y.o. male with history of hypertension hyperlipidemia.  He has had some shortness of  breath with exertion but this seems to be more physiologic.  I last saw him in 2018. Seems to be doing well.   Still having some DOE when he exerts himself. Still does lots of yard work .  His yard has a large hill.   Recovers quicily,  No cp, just needs to catch his breath.  Uses his treadmill during the winter months   VS look good  No wt available   Lipids are being managed by Dr. Joylene Draft   The patient does not have symptoms concerning for COVID-19 infection (fever, chills, cough, or new shortness of breath).    Past Medical History:  Diagnosis Date  . Colon polyps    ADENOMA  . Erectile dysfunction of organic origin   . Hemorrhoids 2006   S/P PROCEDURE  . Hyperlipidemia 2001  . Hypertension 1999   ON MEDS  . Hypothyroidism   . Impaired fasting glucose 2005  . Prostatic hypertrophy    HX OF, BENIGN  . Shingles 2006   ON BACK  . Vitamin D deficiency      Current Meds  Medication Sig  . amLODipine (NORVASC) 5 MG tablet Take 5 mg by mouth daily.  [name] lisinopril (PRINIVIL,ZESTRIL) 40 MG tablet Take 40 mg by mouth daily.  . Omega-3 Fatty Acids (FISH OIL) 1000 MG CAPS Take 2 capsules by mouth daily.  . simvastatin (ZOCOR)  80 MG tablet Take 20 mg by mouth daily. TAKE 1/4 TABLET BY MOUTH DAILY IN EVENING.   . Travoprost, BAK Free, (TRAVATAN) 0.004 % SOLN ophthalmic solution Place 1 drop into both eyes at bedtime.   . vitamin B-12 (CYANOCOBALAMIN) 1000 MCG tablet Take 1,000 mcg by mouth daily.     Allergies:   Patient has no known allergies.   Social History   Tobacco Use  . Smoking status: Former Research scientist (life sciences)  . Smokeless tobacco: Never Used  Substance Use Topics  . Alcohol use: Yes  . Drug use: No     Family Hx: The patient's family history includes CAD in his father; CVA in his mother; Stroke in his mother.  ROS:   Please see the history of present illness.     All other systems reviewed and are negative.   Prior CV studies:   The following studies were  reviewed today:    Labs/Other Tests and Data Reviewed:    EKG:  No ECG reviewed.  Recent Labs: No results found for requested labs within last 8760 hours.   Recent Lipid Panel No results found for: CHOL, TRIG, HDL, CHOLHDL, LDLCALC, LDLDIRECT  Wt Readings from Last 3 Encounters:  10/14/16 150 lb (68 kg)     Objective:    Vital Signs:  BP 130/79   Pulse 60   Ht 5\' 9"  (1.753 m)   BMI 22.15 kg/m   No additional exam is available ( telephone visit 0    ASSESSMENT & PLAN:    1. HTN:  BP looks good.  Cont current meds  Continue current medications.  He admits that he eats little bit more salt than he should.  I advised him to watch his salt intake.  2.  Hyperlipidemia :   Managed by Dr. Joylene Draft  Have asked him to get his most recent set of labs faxed over to [place].  COVID-19 Education: The signs and symptoms of COVID-19 were discussed with the patient and how to seek care for testing (follow up with PCP or arrange E-visit).  The importance of social distancing was discussed today.  Time:   Today, I have spent  15  minutes with the patient with telehealth technology discussing the above problems.     Medication Adjustments/Labs and Tests Ordered: Current medicines are reviewed at length with the patient today.  Concerns regarding medicines are outlined above.   Tests Ordered: No orders of the defined types were placed in this encounter.   Medication Changes: No orders of the defined types were placed in this encounter.   Follow Up:  In Person in 1 year(s)  Signed, Mertie Moores, MD  02/08/2019 11:22 AM    [facility]

## 2019-02-08 NOTE — Patient Instructions (Addendum)
Medication Instructions:  Your physician recommends that you continue on your current medications as directed. Please refer to the Current Medication list given to you today.  *If you need a refill on your cardiac medications before your next appointment, please call your pharmacy*  Lab Work: None Ordered   Testing/Procedures: None Ordered   Follow-Up: At [facility], you and your health needs are our priority.  As part of our continuing mission to provide you with exceptional heart care, we have created designated Provider Care Teams.  These Care Teams include your primary Cardiologist (physician) and Advanced Practice Providers (APPs -  Physician Assistants and Nurse Practitioners) who all work together to provide you with the care you need, when you need it.  Your next appointment:   1 year(s)  The format for your next appointment:   In Person  Provider:   You may see Dr. Acie Fredrickson or one of the following Advanced Practice Providers on your designated Care Team:    Richardson Dopp, PA-C  [place], [place]  Daune Perch, [place]

## 2019-03-11 ENCOUNTER — Ambulatory Visit: Payer: Medicare Other | Attending: Internal Medicine

## 2019-03-11 DIAGNOSIS — Z23 Encounter for immunization: Secondary | ICD-10-CM | POA: Insufficient documentation

## 2019-03-11 NOTE — Progress Notes (Signed)
   Covid-19 Vaccination Clinic  Name:  Anthony Allison    MRN: [id number] DOB: February 03, 1936  03/11/2019  Anthony Allison was observed post Covid-19 immunization for 15 minutes without incidence. He was provided with Vaccine Information Sheet and instruction to access the V-Safe system.   Anthony Allison was instructed to call 911 with any severe reactions post vaccine: [name] Difficulty breathing  . Swelling of your face and throat  . A fast heartbeat  . A bad rash all over your body  . Dizziness and weakness    Immunizations Administered    Name Date Dose VIS Date Route   Pfizer COVID-19 Vaccine 03/11/2019 11:20 AM 0.3 mL 02/03/2019 Intramuscular   Manufacturer: [place]   Lot: [id number]   [place]: [id number]

## 2019-04-01 ENCOUNTER — Ambulatory Visit: Payer: Medicare Other | Attending: Internal Medicine

## 2019-04-01 DIAGNOSIS — Z23 Encounter for immunization: Secondary | ICD-10-CM | POA: Insufficient documentation

## 2019-04-01 NOTE — Progress Notes (Signed)
   Covid-19 Vaccination Clinic  Name:  Anthony Allison    MRN: [id number] DOB: [birth year]  04/01/2019  [name] was observed post Covid-19 immunization for 15 minutes without incidence. He was provided with Vaccine Information Sheet and instruction to access the V-Safe system.   [name] was instructed to call 911 with any severe reactions post vaccine: [name] Difficulty breathing  . Swelling of your face and throat  . A fast heartbeat  . A bad rash all over your body  . Dizziness and weakness    Immunizations Administered    Name Date Dose VIS Date Route   Pfizer COVID-19 Vaccine 04/01/2019 10:31 AM 0.3 mL 02/03/2019 Intramuscular   Manufacturer: [place]   Lot: [id number]   [place]: [id number]

## 2019-07-14 DIAGNOSIS — Z125 Encounter for screening for malignant neoplasm of prostate: Secondary | ICD-10-CM | POA: Diagnosis not present

## 2019-07-14 DIAGNOSIS — E291 Testicular hypofunction: Secondary | ICD-10-CM | POA: Diagnosis not present

## 2019-07-14 DIAGNOSIS — I10 Essential (primary) hypertension: Secondary | ICD-10-CM | POA: Diagnosis not present

## 2019-07-14 DIAGNOSIS — E119 Type 2 diabetes mellitus without complications: Secondary | ICD-10-CM | POA: Diagnosis not present

## 2019-07-14 DIAGNOSIS — E538 Deficiency of other specified B group vitamins: Secondary | ICD-10-CM | POA: Diagnosis not present

## 2019-07-14 DIAGNOSIS — M859 Disorder of bone density and structure, unspecified: Secondary | ICD-10-CM | POA: Diagnosis not present

## 2019-07-14 DIAGNOSIS — E559 Vitamin D deficiency, unspecified: Secondary | ICD-10-CM | POA: Diagnosis not present

## 2019-07-14 DIAGNOSIS — Z0000 Encounter for general adult medical examination without abnormal findings: Secondary | ICD-10-CM | POA: Diagnosis not present

## 2019-07-14 DIAGNOSIS — E7849 Other hyperlipidemia: Secondary | ICD-10-CM | POA: Diagnosis not present

## 2019-07-21 DIAGNOSIS — E1169 Type 2 diabetes mellitus with other specified complication: Secondary | ICD-10-CM | POA: Diagnosis not present

## 2019-07-21 DIAGNOSIS — Z1331 Encounter for screening for depression: Secondary | ICD-10-CM | POA: Diagnosis not present

## 2019-07-21 DIAGNOSIS — R82998 Other abnormal findings in urine: Secondary | ICD-10-CM | POA: Diagnosis not present

## 2019-07-21 DIAGNOSIS — Z0000 Encounter for general adult medical examination without abnormal findings: Secondary | ICD-10-CM | POA: Diagnosis not present

## 2019-07-21 DIAGNOSIS — Z1389 Encounter for screening for other disorder: Secondary | ICD-10-CM | POA: Diagnosis not present

## 2019-07-21 DIAGNOSIS — M859 Disorder of bone density and structure, unspecified: Secondary | ICD-10-CM | POA: Diagnosis not present

## 2019-07-21 DIAGNOSIS — E876 Hypokalemia: Secondary | ICD-10-CM | POA: Diagnosis not present

## 2019-07-21 DIAGNOSIS — E7849 Other hyperlipidemia: Secondary | ICD-10-CM | POA: Diagnosis not present

## 2019-07-21 DIAGNOSIS — I10 Essential (primary) hypertension: Secondary | ICD-10-CM | POA: Diagnosis not present

## 2019-07-21 DIAGNOSIS — E538 Deficiency of other specified B group vitamins: Secondary | ICD-10-CM | POA: Diagnosis not present

## 2019-07-21 DIAGNOSIS — N3281 Overactive bladder: Secondary | ICD-10-CM | POA: Diagnosis not present

## 2019-07-21 DIAGNOSIS — Z1212 Encounter for screening for malignant neoplasm of rectum: Secondary | ICD-10-CM | POA: Diagnosis not present

## 2019-07-25 ENCOUNTER — Other Ambulatory Visit: Payer: Self-pay | Admitting: Internal Medicine

## 2019-07-25 DIAGNOSIS — R413 Other amnesia: Secondary | ICD-10-CM

## 2019-07-27 DIAGNOSIS — Z961 Presence of intraocular lens: Secondary | ICD-10-CM | POA: Diagnosis not present

## 2019-07-27 DIAGNOSIS — H401332 Pigmentary glaucoma, bilateral, moderate stage: Secondary | ICD-10-CM | POA: Diagnosis not present

## 2019-08-09 DIAGNOSIS — L570 Actinic keratosis: Secondary | ICD-10-CM | POA: Diagnosis not present

## 2019-08-09 DIAGNOSIS — L814 Other melanin hyperpigmentation: Secondary | ICD-10-CM | POA: Diagnosis not present

## 2019-08-09 DIAGNOSIS — Z85828 Personal history of other malignant neoplasm of skin: Secondary | ICD-10-CM | POA: Diagnosis not present

## 2019-08-09 DIAGNOSIS — L820 Inflamed seborrheic keratosis: Secondary | ICD-10-CM | POA: Diagnosis not present

## 2019-08-09 DIAGNOSIS — D225 Melanocytic nevi of trunk: Secondary | ICD-10-CM | POA: Diagnosis not present

## 2019-08-09 DIAGNOSIS — D2271 Melanocytic nevi of right lower limb, including hip: Secondary | ICD-10-CM | POA: Diagnosis not present

## 2019-08-09 DIAGNOSIS — Z85820 Personal history of malignant melanoma of skin: Secondary | ICD-10-CM | POA: Diagnosis not present

## 2019-08-09 DIAGNOSIS — L821 Other seborrheic keratosis: Secondary | ICD-10-CM | POA: Diagnosis not present

## 2019-08-22 ENCOUNTER — Ambulatory Visit
Admission: RE | Admit: 2019-08-22 | Discharge: 2019-08-22 | Disposition: A | Discharge status: Home / Self Care | Payer: Medicare HMO | Source: Ambulatory Visit | Attending: Internal Medicine | Admitting: Internal Medicine

## 2019-08-22 ENCOUNTER — Other Ambulatory Visit: Payer: Self-pay

## 2019-08-22 DIAGNOSIS — R413 Other amnesia: Secondary | ICD-10-CM | POA: Diagnosis not present

## 2019-08-22 DIAGNOSIS — R9082 White matter disease, unspecified: Secondary | ICD-10-CM | POA: Diagnosis not present

## 2019-08-22 DIAGNOSIS — G9389 Other specified disorders of brain: Secondary | ICD-10-CM | POA: Diagnosis not present

## 2020-01-17 DIAGNOSIS — M8580 Other specified disorders of bone density and structure, unspecified site: Secondary | ICD-10-CM | POA: Diagnosis not present

## 2020-01-17 DIAGNOSIS — E1169 Type 2 diabetes mellitus with other specified complication: Secondary | ICD-10-CM | POA: Diagnosis not present

## 2020-01-17 DIAGNOSIS — E785 Hyperlipidemia, unspecified: Secondary | ICD-10-CM | POA: Diagnosis not present

## 2020-01-17 DIAGNOSIS — Z23 Encounter for immunization: Secondary | ICD-10-CM | POA: Diagnosis not present

## 2020-01-17 DIAGNOSIS — R413 Other amnesia: Secondary | ICD-10-CM | POA: Diagnosis not present

## 2020-01-17 DIAGNOSIS — E538 Deficiency of other specified B group vitamins: Secondary | ICD-10-CM | POA: Diagnosis not present

## 2020-01-17 DIAGNOSIS — I10 Essential (primary) hypertension: Secondary | ICD-10-CM | POA: Diagnosis not present

## 2020-01-17 DIAGNOSIS — E291 Testicular hypofunction: Secondary | ICD-10-CM | POA: Diagnosis not present

## 2020-02-01 DIAGNOSIS — H43813 Vitreous degeneration, bilateral: Secondary | ICD-10-CM | POA: Diagnosis not present

## 2020-02-01 DIAGNOSIS — H401332 Pigmentary glaucoma, bilateral, moderate stage: Secondary | ICD-10-CM | POA: Diagnosis not present

## 2020-02-01 DIAGNOSIS — Z961 Presence of intraocular lens: Secondary | ICD-10-CM | POA: Diagnosis not present

## 2020-02-01 DIAGNOSIS — H52203 Unspecified astigmatism, bilateral: Secondary | ICD-10-CM | POA: Diagnosis not present

## 2020-06-27 ENCOUNTER — Ambulatory Visit: Payer: Medicare HMO | Admitting: Cardiovascular Disease

## 2020-06-27 ENCOUNTER — Other Ambulatory Visit: Payer: Self-pay

## 2020-06-27 ENCOUNTER — Encounter: Payer: Self-pay | Admitting: Cardiovascular Disease

## 2020-06-27 VITALS — BP 128/80 | HR 54 | Ht 67.0 in | Wt 153.4 lb

## 2020-06-27 DIAGNOSIS — R0600 Dyspnea, unspecified: Secondary | ICD-10-CM

## 2020-06-27 DIAGNOSIS — R0609 Other forms of dyspnea: Secondary | ICD-10-CM

## 2020-06-27 NOTE — Progress Notes (Signed)
Cardiology Office Note:    Date:  06/27/2020   ID:  Anthony Allison, DOB 11-14-1935, MRN [id number]  PCP:  Crist Infante, MD  Cardiologist:  Mertie Moores, MD    Referring MD: Crist Infante, MD   Problem list 1. Hypertension 2. Hyperlipidemia 3. Right bundle branch block 4. Dyspnea on exertion  Chief Complaint  Patient presents with  . Hypertension    Previous notes   Anthony Allison is a 85 y.o. male with a hx of  HTN, hyperlipidemia ,  I saw Teion many years ago He recently went to a class reunion and talked to a classmate who had had a heart attack. He became concerned about some of his DOE Denies any CP .   Has some dyspnea when he mows the lawn - 45 min - 1 hr.  Push mower weed eats, blows grass .   Does not have to stop .   Previous pipe smoker - 20 years ago   Jun 27, 2020: Anthony Allison is seen today for follow up of his HTN, HLD. Has had DOE with very vigorous activity in the past.   Has seemed to be more physiologic than a true issue  He was seen remotely in 2020 due to Covid  Continues to have short winded.   Gets winded when he is out working in the garden  We have done a GXT in the past  - was normal by his memory    Past Medical History:  Diagnosis Date  . Colon polyps    ADENOMA  . Erectile dysfunction of organic origin   . Hemorrhoids 2006   S/P PROCEDURE  . Hyperlipidemia 2001  . Hypertension 1999   ON MEDS  . Hypothyroidism   . Impaired fasting glucose 2005  . Prostatic hypertrophy    HX OF, BENIGN  . Shingles 2006   ON BACK  . Vitamin D deficiency     Past Surgical History:  Procedure Laterality Date  . EYE SURGERY Bilateral 2015 OR 2016   CATARACTS REMOVED  . GLAUCOMA SURGERY  2015    Current Medications: Current Meds  Medication Sig  . amLODipine (NORVASC) 5 MG tablet Take 5 mg by mouth daily.  [name] lisinopril (PRINIVIL,ZESTRIL) 40 MG tablet Take 40 mg by mouth daily.  . Omega-3 Fatty Acids (FISH OIL) 1000 MG CAPS Take 2 capsules  by mouth daily.  . simvastatin (ZOCOR) 80 MG tablet Take 20 mg by mouth daily. TAKE 1/4 TABLET BY MOUTH DAILY IN EVENING.  . Travoprost, BAK Free, (TRAVATAN) 0.004 % SOLN ophthalmic solution Place 1 drop into both eyes at bedtime.   . vitamin B-12 (CYANOCOBALAMIN) 1000 MCG tablet Take 1,000 mcg by mouth daily.     Allergies:   Patient has no known allergies.   Social History   Socioeconomic History  . Marital status: Married    Spouse name: Not on file  . Number of children: 3  . Years of education: Not on file  . Highest education level: Not on file  Occupational History  . Occupation: ARCHITECT  Tobacco Use  . Smoking status: Former Research scientist (life sciences)  . Smokeless tobacco: Never Used  Vaping Use  . Vaping Use: Never used  Substance and Sexual Activity  . Alcohol use: Yes  . Drug use: No  . Sexual activity: Not on file  Other Topics Concern  . Not on file  Social History Narrative  . Not on file   Social Determinants of Health   Financial  Resource Strain: Not on file  Food Insecurity: Not on file  Transportation Needs: Not on file  Physical Activity: Not on file  Stress: Not on file  Social Connections: Not on file     Family History: The patient's family history includes CAD in his father; CVA in his mother; Stroke in his mother. ROS:   Please see the history of present illness.     All other systems reviewed and are negative.  EKGs/Labs/Other Studies Reviewed:    The following studies were reviewed today:     Recent Labs: No results found for requested labs within last 8760 hours.  Recent Lipid Panel No results found for: CHOL, TRIG, HDL, CHOLHDL, VLDL, LDLCALC, LDLDIRECT  Physical Exam:     Physical Exam: Blood pressure 128/80, pulse (!) 54, height 5\' 7"  (1.702 m), weight 153 lb 6.4 oz (69.6 kg), SpO2 96 %.  GEN:  Well nourished, well developed in no acute distress HEENT: Normal NECK: No JVD; No carotid bruits LYMPHATICS: No lymphadenopathy CARDIAC: RRR ,  no murmurs, rubs, gallops RESPIRATORY:  Clear to auscultation without rales, wheezing or rhonchi  ABDOMEN: Soft, non-tender, non-distended MUSCULOSKELETAL:  No edema; No deformity  SKIN: Warm and dry NEUROLOGIC:  Alert and oriented x 3  EKG:    Jun 27, 2020:   Sinus brady at 72.  RBBB   ASSESSMENT:    1. DOE (dyspnea on exertion)    PLAN:       1. Hypertension -   BP is well controlled.    2. Hyperlipidemia - on simvastatin,    Managed by his primary MD   3. Right bundle branch block - stable   4. Dyspnea on exertion -  We did a GXT years ago .  Will repeat GXT  Office visit in 1 year.    Medication Adjustments/Labs and Tests Ordered: Current medicines are reviewed at length with the patient today.  Concerns regarding medicines are outlined above.  Orders Placed This Encounter  Procedures  . EXERCISE TOLERANCE TEST (ETT)  . EKG 12-Lead   No orders of the defined types were placed in this encounter.   Signed, Mertie Moores, MD  06/27/2020 3:54 PM    [place]

## 2020-06-27 NOTE — Patient Instructions (Signed)
Medication Instructions:  Your physician recommends that you continue on your current medications as directed. Please refer to the Current Medication list given to you today.  *If you need a refill on your cardiac medications before your next appointment, please call your pharmacy*   Lab Work: none If you have labs (blood work) drawn today and your tests are completely normal, you will receive your results only by: [name] MyChart Message (if you have MyChart) OR . A paper copy in the mail If you have any lab test that is abnormal or we need to change your treatment, we will call you to review the results.   Testing/Procedures: Your physician has requested that you have an exercise tolerance test. For further information please visit [email or web address]. Please also follow instruction sheet, as given.    Follow-Up: At [facility], you and your health needs are our priority.  As part of our continuing mission to provide you with exceptional heart care, we have created designated Provider Care Teams.  These Care Teams include your primary Cardiologist (physician) and Advanced Practice Providers (APPs -  Physician Assistants and Nurse Practitioners) who all work together to provide you with the care you need, when you need it.  We recommend signing up for the patient portal called "MyChart".  Sign up information is provided on this After Visit Summary.  MyChart is used to connect with patients for Virtual Visits (Telemedicine).  Patients are able to view lab/test results, encounter notes, upcoming appointments, etc.  Non-urgent messages can be sent to your provider as well.   To learn more about what you can do with MyChart, go to [email or web address].    Your next appointment:   1 year(s)  The format for your next appointment:   In Person  Provider:   You may see Mertie Moores, MD or one of the following Advanced Practice Providers on your designated Care Team:    Richardson Dopp,  PA-C  [place], [place]

## 2020-07-31 DIAGNOSIS — E1169 Type 2 diabetes mellitus with other specified complication: Secondary | ICD-10-CM | POA: Diagnosis not present

## 2020-07-31 DIAGNOSIS — E785 Hyperlipidemia, unspecified: Secondary | ICD-10-CM | POA: Diagnosis not present

## 2020-07-31 DIAGNOSIS — E538 Deficiency of other specified B group vitamins: Secondary | ICD-10-CM | POA: Diagnosis not present

## 2020-07-31 DIAGNOSIS — E291 Testicular hypofunction: Secondary | ICD-10-CM | POA: Diagnosis not present

## 2020-07-31 DIAGNOSIS — E559 Vitamin D deficiency, unspecified: Secondary | ICD-10-CM | POA: Diagnosis not present

## 2020-07-31 DIAGNOSIS — Z0000 Encounter for general adult medical examination without abnormal findings: Secondary | ICD-10-CM | POA: Diagnosis not present

## 2020-07-31 DIAGNOSIS — Z125 Encounter for screening for malignant neoplasm of prostate: Secondary | ICD-10-CM | POA: Diagnosis not present

## 2020-08-01 DIAGNOSIS — H401332 Pigmentary glaucoma, bilateral, moderate stage: Secondary | ICD-10-CM | POA: Diagnosis not present

## 2020-08-06 ENCOUNTER — Ambulatory Visit (INDEPENDENT_AMBULATORY_CARE_PROVIDER_SITE_OTHER): Payer: Medicare HMO

## 2020-08-06 ENCOUNTER — Other Ambulatory Visit: Payer: Self-pay

## 2020-08-06 DIAGNOSIS — R0600 Dyspnea, unspecified: Secondary | ICD-10-CM | POA: Diagnosis not present

## 2020-08-06 DIAGNOSIS — R0609 Other forms of dyspnea: Secondary | ICD-10-CM

## 2020-08-06 LAB — EXERCISE TOLERANCE TEST
Estimated workload: 10.1 METS
Exercise duration (min): 8 min
Exercise duration (sec): 0 s
MPHR: 137 {beats}/min
Peak HR: 96 {beats}/min
Percent HR: 70 %
RPE: 17
Rest HR: 50 {beats}/min

## 2020-08-07 DIAGNOSIS — R7301 Impaired fasting glucose: Secondary | ICD-10-CM | POA: Diagnosis not present

## 2020-08-07 DIAGNOSIS — E1169 Type 2 diabetes mellitus with other specified complication: Secondary | ICD-10-CM | POA: Diagnosis not present

## 2020-08-07 DIAGNOSIS — E119 Type 2 diabetes mellitus without complications: Secondary | ICD-10-CM | POA: Diagnosis not present

## 2020-08-07 DIAGNOSIS — R82998 Other abnormal findings in urine: Secondary | ICD-10-CM | POA: Diagnosis not present

## 2020-08-07 DIAGNOSIS — I10 Essential (primary) hypertension: Secondary | ICD-10-CM | POA: Diagnosis not present

## 2020-08-07 DIAGNOSIS — M8580 Other specified disorders of bone density and structure, unspecified site: Secondary | ICD-10-CM | POA: Diagnosis not present

## 2020-08-07 DIAGNOSIS — E785 Hyperlipidemia, unspecified: Secondary | ICD-10-CM | POA: Diagnosis not present

## 2020-08-07 DIAGNOSIS — Z0000 Encounter for general adult medical examination without abnormal findings: Secondary | ICD-10-CM | POA: Diagnosis not present

## 2020-08-07 DIAGNOSIS — R0609 Other forms of dyspnea: Secondary | ICD-10-CM | POA: Diagnosis not present

## 2020-08-07 DIAGNOSIS — R413 Other amnesia: Secondary | ICD-10-CM | POA: Diagnosis not present

## 2020-08-08 DIAGNOSIS — Z85828 Personal history of other malignant neoplasm of skin: Secondary | ICD-10-CM | POA: Diagnosis not present

## 2020-08-08 DIAGNOSIS — D1801 Hemangioma of skin and subcutaneous tissue: Secondary | ICD-10-CM | POA: Diagnosis not present

## 2020-08-08 DIAGNOSIS — D225 Melanocytic nevi of trunk: Secondary | ICD-10-CM | POA: Diagnosis not present

## 2020-08-08 DIAGNOSIS — L814 Other melanin hyperpigmentation: Secondary | ICD-10-CM | POA: Diagnosis not present

## 2020-08-08 DIAGNOSIS — L080 Pyoderma: Secondary | ICD-10-CM | POA: Diagnosis not present

## 2020-08-08 DIAGNOSIS — Z85820 Personal history of malignant melanoma of skin: Secondary | ICD-10-CM | POA: Diagnosis not present

## 2020-08-08 DIAGNOSIS — D485 Neoplasm of uncertain behavior of skin: Secondary | ICD-10-CM | POA: Diagnosis not present

## 2020-08-08 DIAGNOSIS — L821 Other seborrheic keratosis: Secondary | ICD-10-CM | POA: Diagnosis not present

## 2020-08-08 DIAGNOSIS — L570 Actinic keratosis: Secondary | ICD-10-CM | POA: Diagnosis not present

## 2020-08-09 ENCOUNTER — Telehealth: Payer: Self-pay

## 2020-08-09 DIAGNOSIS — R072 Precordial pain: Secondary | ICD-10-CM

## 2020-08-09 NOTE — Telephone Encounter (Signed)
-----   Message from Thayer Headings, MD sent at 08/07/2020  8:05 AM EDT ----- Nondiagnostic GXT Please order a Lexiscan myoview to rule out ischemia and echo to assess his DOE

## 2020-08-09 NOTE — Telephone Encounter (Signed)
RN returned call to patient regarding recommendations for a Lexiscan. Patient in agreement. Instructions reviewed. Patient request instructions be mailed once appointment is scheduled for a reminder.

## 2020-08-09 NOTE — Progress Notes (Unsigned)
Stress test instructions

## 2020-08-20 ENCOUNTER — Telehealth: Payer: Self-pay | Admitting: Cardiovascular Disease

## 2020-08-20 NOTE — Telephone Encounter (Signed)
Spoke with patient who is concerned about his upcoming [place] and that he understood he needed to take a medication prior to his stress testing.  Advised pt he has been ordered to have a Lexiscan Myoview stress test but there is no medication he needs to take prior to the test.  He states understanding, thanked me for the call and had no further questions at this time.

## 2020-08-20 NOTE — Telephone Encounter (Signed)
Patient states he was told he he needed to take a medication for his stress test 08/27/2020. He states he does not know the name and he is not sure if it was supposed to be sent to the pharmacy or the mail. He states the pharmacy has not received it and he is on vacation all this week.

## 2020-08-21 ENCOUNTER — Telehealth (HOSPITAL_COMMUNITY): Payer: Self-pay

## 2020-08-21 NOTE — Telephone Encounter (Signed)
Detailed instructions left on the patient's answering machine. Asked to call back with any questions. S.Dagny Fiorentino EMTP 

## 2020-08-27 ENCOUNTER — Other Ambulatory Visit: Payer: Self-pay

## 2020-08-27 ENCOUNTER — Ambulatory Visit (HOSPITAL_COMMUNITY): Payer: Medicare HMO | Attending: Cardiovascular Disease

## 2020-08-27 DIAGNOSIS — R072 Precordial pain: Secondary | ICD-10-CM | POA: Diagnosis not present

## 2020-08-27 LAB — MYOCARDIAL PERFUSION IMAGING
LV dias vol: 91 mL (ref 62–150)
LV sys vol: 37 mL
Peak HR: 71 {beats}/min
Rest HR: 57 {beats}/min
SDS: 0
SRS: 0
SSS: 0
TID: 0.99

## 2020-08-27 MED ORDER — REGADENOSON 0.4 MG/5ML IV SOLN
0.4000 mg | Freq: Once | INTRAVENOUS | Status: AC
  Administered 2020-08-27: 0.4 mg via INTRAVENOUS

## 2020-08-27 MED ORDER — TECHNETIUM TC 99M TETROFOSMIN IV KIT
32.4000 | PACK | Freq: Once | INTRAVENOUS | Status: AC | PRN
  Administered 2020-08-27: 32.4 via INTRAVENOUS
  Filled 2020-08-27: qty 33

## 2020-08-27 MED ORDER — TECHNETIUM TC 99M TETROFOSMIN IV KIT
10.3000 | PACK | Freq: Once | INTRAVENOUS | Status: AC | PRN
  Administered 2020-08-27: 10.3 via INTRAVENOUS
  Filled 2020-08-27: qty 11

## 2020-09-05 DIAGNOSIS — M8589 Other specified disorders of bone density and structure, multiple sites: Secondary | ICD-10-CM | POA: Diagnosis not present

## 2020-10-21 DIAGNOSIS — S8011XA Contusion of right lower leg, initial encounter: Secondary | ICD-10-CM | POA: Diagnosis not present

## 2020-10-21 DIAGNOSIS — M7989 Other specified soft tissue disorders: Secondary | ICD-10-CM | POA: Diagnosis not present

## 2020-10-27 ENCOUNTER — Other Ambulatory Visit: Payer: Self-pay

## 2020-10-27 ENCOUNTER — Emergency Department (HOSPITAL_COMMUNITY)
Admission: EM | Admit: 2020-10-27 | Discharge: 2020-10-28 | Disposition: A | Discharge status: Home / Self Care | Payer: Medicare HMO | Attending: Emergency Medicine | Admitting: Emergency Medicine

## 2020-10-27 DIAGNOSIS — N390 Urinary tract infection, site not specified: Secondary | ICD-10-CM | POA: Insufficient documentation

## 2020-10-27 DIAGNOSIS — E039 Hypothyroidism, unspecified: Secondary | ICD-10-CM | POA: Insufficient documentation

## 2020-10-27 DIAGNOSIS — Z87891 Personal history of nicotine dependence: Secondary | ICD-10-CM | POA: Diagnosis not present

## 2020-10-27 DIAGNOSIS — R197 Diarrhea, unspecified: Secondary | ICD-10-CM | POA: Insufficient documentation

## 2020-10-27 DIAGNOSIS — R1030 Lower abdominal pain, unspecified: Secondary | ICD-10-CM | POA: Diagnosis present

## 2020-10-27 DIAGNOSIS — N3000 Acute cystitis without hematuria: Secondary | ICD-10-CM

## 2020-10-27 DIAGNOSIS — Z79899 Other long term (current) drug therapy: Secondary | ICD-10-CM | POA: Diagnosis not present

## 2020-10-27 DIAGNOSIS — I10 Essential (primary) hypertension: Secondary | ICD-10-CM | POA: Diagnosis not present

## 2020-10-27 DIAGNOSIS — E860 Dehydration: Secondary | ICD-10-CM | POA: Insufficient documentation

## 2020-10-27 LAB — URINALYSIS, ROUTINE W REFLEX MICROSCOPIC
Bilirubin Urine: NEGATIVE
Glucose, UA: 100 mg/dL — AB
Ketones, ur: 15 mg/dL — AB
Nitrite: NEGATIVE
Protein, ur: 30 mg/dL — AB
Specific Gravity, Urine: >1.03 — ABNORMAL HIGH (ref 1.005–1.030)
pH: 6 (ref 5.0–8.0)

## 2020-10-27 LAB — CBC WITH DIFFERENTIAL/PLATELET
Abs Immature Granulocytes: 0.02 10*3/uL (ref 0.00–0.07)
Basophils Absolute: 0 10*3/uL (ref 0.0–0.1)
Basophils Relative: 0 %
Eosinophils Absolute: 0 10*3/uL (ref 0.0–0.5)
Eosinophils Relative: 0 %
HCT: 44.8 % (ref 39.0–52.0)
Hemoglobin: 14.9 g/dL (ref 13.0–17.0)
Immature Granulocytes: 0 %
Lymphocytes Relative: 5 %
Lymphs Abs: 0.4 10*3/uL — ABNORMAL LOW (ref 0.7–4.0)
MCH: 31.3 pg (ref 26.0–34.0)
MCHC: 33.3 g/dL (ref 30.0–36.0)
MCV: 94.1 fL (ref 80.0–100.0)
Monocytes Absolute: 0 10*3/uL — ABNORMAL LOW (ref 0.1–1.0)
Monocytes Relative: 1 %
Neutro Abs: 7.3 10*3/uL (ref 1.7–7.7)
Neutrophils Relative %: 94 %
Platelets: 175 10*3/uL (ref 150–400)
RBC: 4.76 MIL/uL (ref 4.22–5.81)
RDW: 13.5 % (ref 11.5–15.5)
WBC: 7.7 10*3/uL (ref 4.0–10.5)
nRBC: 0 % (ref 0.0–0.2)

## 2020-10-27 LAB — COMPREHENSIVE METABOLIC PANEL
ALT: 20 U/L (ref 0–44)
AST: 28 U/L (ref 15–41)
Albumin: 4.5 g/dL (ref 3.5–5.0)
Alkaline Phosphatase: 94 U/L (ref 38–126)
Anion gap: 13 (ref 5–15)
BUN: 26 mg/dL — ABNORMAL HIGH (ref 8–23)
CO2: 23 mmol/L (ref 22–32)
Calcium: 9.6 mg/dL (ref 8.9–10.3)
Chloride: 103 mmol/L (ref 98–111)
Creatinine, Ser: 1.46 mg/dL — ABNORMAL HIGH (ref 0.61–1.24)
GFR, Estimated: 47 mL/min — ABNORMAL LOW (ref >60)
Glucose, Bld: 156 mg/dL — ABNORMAL HIGH (ref 70–99)
Potassium: 3.7 mmol/L (ref 3.5–5.1)
Sodium: 139 mmol/L (ref 135–145)
Total Bilirubin: 1.4 mg/dL — ABNORMAL HIGH (ref 0.3–1.2)
Total Protein: 7.8 g/dL (ref 6.5–8.1)

## 2020-10-27 LAB — LIPASE, BLOOD: Lipase: 31 U/L (ref 11–51)

## 2020-10-27 NOTE — ED Triage Notes (Signed)
Pt complains of nausea and abdominal pain x 5 hrs.

## 2020-10-28 MED ORDER — SODIUM CHLORIDE 0.9 % IV SOLN
1.0000 g | Freq: Once | INTRAVENOUS | Status: AC
  Administered 2020-10-28: 1 g via INTRAVENOUS
  Filled 2020-10-28: qty 10

## 2020-10-28 MED ORDER — SODIUM CHLORIDE 0.9 % IV BOLUS
500.0000 mL | Freq: Once | INTRAVENOUS | Status: AC
  Administered 2020-10-28: 500 mL via INTRAVENOUS

## 2020-10-28 MED ORDER — CEPHALEXIN 500 MG PO CAPS: 500.0000 mg | ORAL_CAPSULE | Freq: Three times a day (TID) | ORAL | 0 refills | Status: DC

## 2020-10-28 NOTE — Discharge Instructions (Addendum)
Please plan to follow up with Dr. Joylene Draft at the end of the week to have your urine rechecked and to review the urine culture that was obtained tonight.   Return to the ED with any new or worsening symptoms.

## 2020-10-28 NOTE — ED Provider Notes (Signed)
[facility] DEPT Provider Note   CSN: [id number] Arrival date & time: 10/27/20  2005     History Chief Complaint  Patient presents with   Abdominal Pain   Emesis    Anthony Allison is a 85 y.o. male.  Patient to ED for evaluation of onset of lower abdominal pain earlier today, associated with shaking chills. No vomiting. He has had diarrhea for the past 3 days, nonbloody stools, no abdominal pain with the diarrhea. Patient and his wife report similar symptoms when he becomes dehydrated. He states that during his wait in the ED tonight, he went to the bathroom for a large bowel movement and his pain resolved. No chest pain, SOB. He denies fever at any time over the last several days.   The history is provided by the patient. No language interpreter was used.  Abdominal Pain Associated symptoms: diarrhea   Associated symptoms: no chills, no dysuria, no fever and no vomiting   Emesis Associated symptoms: abdominal pain and diarrhea   Associated symptoms: no chills and no fever       Past Medical History:  Diagnosis Date   Colon polyps    ADENOMA   Erectile dysfunction of organic origin    Hemorrhoids 2006   S/P PROCEDURE   Hyperlipidemia 2001   Hypertension 1999   ON MEDS   Hypothyroidism    Impaired fasting glucose 2005   Prostatic hypertrophy    HX OF, BENIGN   Shingles 2006   ON BACK   Vitamin D deficiency     Patient Active Problem List   Diagnosis Date Noted   DOE (dyspnea on exertion) 10/14/2016   Essential hypertension 10/14/2016   Mixed hyperlipidemia 10/14/2016    Past Surgical History:  Procedure Laterality Date   EYE SURGERY Bilateral 2015 OR 2016   CATARACTS REMOVED   GLAUCOMA SURGERY  2015       Family History  Problem Relation Age of Onset   CVA Mother    Stroke Mother    CAD Father     Social History   Tobacco Use   Smoking status: Former   Smokeless tobacco: Never  Scientific laboratory technician Use: Never  used  Substance Use Topics   Alcohol use: Yes   Drug use: No    Home Medications Prior to Admission medications   Medication Sig Start Date End Date Taking? Authorizing Provider  amLODipine (NORVASC) 5 MG tablet Take 5 mg by mouth daily.    [provider]  galantamine (RAZADYNE ER) 16 MG 24 hr capsule Take 16 mg by mouth daily. 04/28/20   [provider]  lisinopril (PRINIVIL,ZESTRIL) 40 MG tablet Take 40 mg by mouth daily.    [provider]  Omega-3 Fatty Acids (FISH OIL) 1000 MG CAPS Take 2 capsules by mouth daily.    [provider]  simvastatin (ZOCOR) 80 MG tablet Take 20 mg by mouth daily. TAKE 1/4 TABLET BY MOUTH DAILY IN EVENING.    [provider]  Travoprost, BAK Free, (TRAVATAN) 0.004 % SOLN ophthalmic solution Place 1 drop into both eyes at bedtime.     [provider]  vitamin B-12 (CYANOCOBALAMIN) 1000 MCG tablet Take 1,000 mcg by mouth daily.    [provider]    Allergies    Patient has no known allergies.  Review of Systems   Review of Systems  Constitutional:  Negative for chills and fever.  HENT: Negative.    Respiratory:  Negative.    Cardiovascular: Negative.   Gastrointestinal:  Positive for abdominal pain and diarrhea. Negative for vomiting.  Genitourinary:  Negative for dysuria.  Musculoskeletal: Negative.  Negative for back pain.  Skin: Negative.   Neurological:  Positive for weakness. Negative for syncope and light-headedness.   Physical Exam Updated Vital Signs BP 91/68 (BP Location: Right Arm)   Pulse 85   Temp 99.6 F (37.6 C) (Oral)   Resp 16   Ht '5\' 7"'$  (1.702 m)   Wt 70.3 kg   SpO2 93%   BMI 24.28 kg/m   Physical Exam Vitals and nursing note reviewed.  Constitutional:      Appearance: He is well-developed.  HENT:     Head: Normocephalic.  Cardiovascular:     Rate and Rhythm: Normal rate and regular rhythm.     Heart sounds: No murmur heard. Pulmonary:     Effort:  Pulmonary effort is normal.     Breath sounds: Normal breath sounds. No wheezing, rhonchi or rales.  Abdominal:     General: Bowel sounds are normal.     Palpations: Abdomen is soft.     Tenderness: There is no abdominal tenderness. There is no guarding or rebound.  Musculoskeletal:        General: Normal range of motion.     Cervical back: Normal range of motion and neck supple.  Skin:    General: Skin is warm and dry.  Neurological:     General: No focal deficit present.     Mental Status: He is alert and oriented to person, place, and time.    ED Results / Procedures / Treatments   Labs (all labs ordered are listed, but only abnormal results are displayed) Labs Reviewed  CBC WITH DIFFERENTIAL/PLATELET - Abnormal; Notable for the following components:      Result Value   Lymphs Abs 0.4 (*)    Monocytes Absolute 0.0 (*)    All other components within normal limits  COMPREHENSIVE METABOLIC PANEL - Abnormal; Notable for the following components:   Glucose, Bld 156 (*)    BUN 26 (*)    Creatinine, Ser 1.46 (*)    Total Bilirubin 1.4 (*)    GFR, Estimated 47 (*)    All other components within normal limits  URINALYSIS, ROUTINE W REFLEX MICROSCOPIC - Abnormal; Notable for the following components:   Color, Urine YELLOW (*)    APPearance CLEAR (*)    Specific Gravity, Urine >1.030 (*)    Glucose, UA 100 (*)    Hgb urine dipstick SMALL (*)    Ketones, ur 15 (*)    Protein, ur 30 (*)    Leukocytes,Ua TRACE (*)    Bacteria, UA RARE (*)    All other components within normal limits  LIPASE, BLOOD   Results for orders placed or performed during the hospital encounter of 10/27/20  CBC with Differential  Result Value Ref Range   WBC 7.7 4.0 - 10.5 K/uL   RBC 4.76 4.22 - 5.81 MIL/uL   Hemoglobin 14.9 13.0 - 17.0 g/dL   HCT 44.8 39.0 - 52.0 %   MCV 94.1 80.0 - 100.0 fL   MCH 31.3 26.0 - 34.0 pg   MCHC 33.3 30.0 - 36.0 g/dL   RDW 13.5 11.5 - 15.5 %   Platelets 175 150 - 400  K/uL   nRBC 0.0 0.0 - 0.2 %   Neutrophils Relative % 94 %   Neutro Abs 7.3 1.7 - 7.7 K/uL  Lymphocytes Relative 5 %   Lymphs Abs 0.4 (L) 0.7 - 4.0 K/uL   Monocytes Relative 1 %   Monocytes Absolute 0.0 (L) 0.1 - 1.0 K/uL   Eosinophils Relative 0 %   Eosinophils Absolute 0.0 0.0 - 0.5 K/uL   Basophils Relative 0 %   Basophils Absolute 0.0 0.0 - 0.1 K/uL   Immature Granulocytes 0 %   Abs Immature Granulocytes 0.02 0.00 - 0.07 K/uL  Comprehensive metabolic panel  Result Value Ref Range   Sodium 139 135 - 145 mmol/L   Potassium 3.7 3.5 - 5.1 mmol/L   Chloride 103 98 - 111 mmol/L   CO2 23 22 - 32 mmol/L   Glucose, Bld 156 (H) 70 - 99 mg/dL   BUN 26 (H) 8 - 23 mg/dL   Creatinine, Ser 1.46 (H) 0.61 - 1.24 mg/dL   Calcium 9.6 8.9 - 10.3 mg/dL   Total Protein 7.8 6.5 - 8.1 g/dL   Albumin 4.5 3.5 - 5.0 g/dL   AST 28 15 - 41 U/L   ALT 20 0 - 44 U/L   Alkaline Phosphatase 94 38 - 126 U/L   Total Bilirubin 1.4 (H) 0.3 - 1.2 mg/dL   GFR, Estimated 47 (L) >60 mL/min   Anion gap 13 5 - 15  Lipase, blood  Result Value Ref Range   Lipase 31 11 - 51 U/L  Urinalysis, Routine w reflex microscopic Urine, Clean Catch  Result Value Ref Range   Color, Urine YELLOW (A) YELLOW   APPearance CLEAR (A) CLEAR   Specific Gravity, Urine >1.030 (H) 1.005 - 1.030   pH 6.0 5.0 - 8.0   Glucose, UA 100 (A) NEGATIVE mg/dL   Hgb urine dipstick SMALL (A) NEGATIVE   Bilirubin Urine NEGATIVE NEGATIVE   Ketones, ur 15 (A) NEGATIVE mg/dL   Protein, ur 30 (A) NEGATIVE mg/dL   Nitrite NEGATIVE NEGATIVE   Leukocytes,Ua TRACE (A) NEGATIVE   RBC / HPF 6-10 0 - 5 RBC/hpf   WBC, UA 21-50 0 - 5 WBC/hpf   Bacteria, UA RARE (A) NONE SEEN   Squamous Epithelial / LPF 0-5 0 - 5   Mucus PRESENT    Hyaline Casts, UA PRESENT     EKG None  Radiology No results found.  Procedures Procedures   Medications Ordered in ED Medications  sodium chloride 0.9 % bolus 500 mL (has no administration in time range)    ED  Course  I have reviewed the triage vital signs and the nursing notes.  Pertinent labs & imaging results that were available during my care of the patient were reviewed by me and considered in my medical decision making (see chart for details).    MDM Rules/Calculators/A&P                           Patient to ED with ss/sxs as per HPI.   He is very well appearing and in NAD. Blood pressure is low, [id number] sytolic. Normal pressures in past chart. He reports earlier symptoms are c/w with previous episodes of dehydration. IV fluids provided with improvement in BP. There is evidence of UTI, culture pending. Rocephin provided in ED.   He has ambulated in the ED without symptoms of lightheadedness, recurrent chills or pain. He can be discharged home with PCP follow up next week for recheck. He has been seen by Dr. Sedonia Small who agrees with care plan.   Final Clinical Impression(s) / ED Diagnoses  Final diagnoses:  None   UTI Dehydration  Rx / DC Orders ED Discharge Orders     None        Charlann Lange, PA-C 10/28/20 0404    Maudie Flakes, MD 10/28/20 [phone]

## 2020-10-30 LAB — URINE CULTURE: Culture: 80000 — AB

## 2020-10-31 ENCOUNTER — Telehealth: Payer: Self-pay

## 2020-10-31 NOTE — Telephone Encounter (Signed)
Post ED Visit - Positive Culture Follow-up  Culture report reviewed by antimicrobial stewardship pharmacist: [place] Team '[]'$  Elenor Quinones, Pharm.D. '[]'$  Heide Guile, Pharm.D., BCPS AQ-ID '[]'$  Parks Neptune, Pharm.D., BCPS '[]'$  Alycia Rossetti, Pharm.D., BCPS '[]'$  [place], Pharm.D., BCPS, AAHIVP '[]'$  Legrand Como, Pharm.D., BCPS, AAHIVP '[]'$  Salome Arnt, PharmD, BCPS '[]'$  Johnnette Gourd, PharmD, BCPS '[]'$  Hughes Better, PharmD, BCPS '[]'$  Leeroy Cha, PharmD '[]'$  Laqueta Linden, PharmD, BCPS '[]'$  Albertina Parr, PharmD  [place] Team '[x]'$  Darcel Smalling, Student PharmD '[]'$  Lindell Spar, PharmD '[]'$  Royetta Asal, PharmD '[]'$  Graylin Shiver, Rph '[]'$  Rema Fendt) Glennon Mac, PharmD '[]'$  Arlyn Dunning, PharmD '[]'$  Netta Cedars, PharmD '[]'$  Dia Sitter, PharmD '[]'$  Leone Haven, PharmD '[]'$  Gretta Arab, PharmD '[]'$  Theodis Shove, PharmD '[]'$  Peggyann Juba, PharmD '[]'$  Reuel Boom, PharmD   Positive urine culture Treated with Cephalexin, organism sensitive to the same and no further patient follow-up is required at this time.  Glennon Hamilton 10/31/2020, 10:26 AM

## 2020-11-01 DIAGNOSIS — E860 Dehydration: Secondary | ICD-10-CM | POA: Diagnosis not present

## 2020-11-01 DIAGNOSIS — I10 Essential (primary) hypertension: Secondary | ICD-10-CM | POA: Diagnosis not present

## 2020-11-01 DIAGNOSIS — R5383 Other fatigue: Secondary | ICD-10-CM | POA: Diagnosis not present

## 2020-11-01 DIAGNOSIS — N3000 Acute cystitis without hematuria: Secondary | ICD-10-CM | POA: Diagnosis not present

## 2020-11-07 DIAGNOSIS — I10 Essential (primary) hypertension: Secondary | ICD-10-CM | POA: Diagnosis not present

## 2020-11-07 DIAGNOSIS — E785 Hyperlipidemia, unspecified: Secondary | ICD-10-CM | POA: Diagnosis not present

## 2021-01-24 DIAGNOSIS — K648 Other hemorrhoids: Secondary | ICD-10-CM | POA: Diagnosis not present

## 2021-01-24 DIAGNOSIS — R159 Full incontinence of feces: Secondary | ICD-10-CM | POA: Diagnosis not present

## 2021-01-31 DIAGNOSIS — Z961 Presence of intraocular lens: Secondary | ICD-10-CM | POA: Diagnosis not present

## 2021-01-31 DIAGNOSIS — H524 Presbyopia: Secondary | ICD-10-CM | POA: Diagnosis not present

## 2021-01-31 DIAGNOSIS — H401332 Pigmentary glaucoma, bilateral, moderate stage: Secondary | ICD-10-CM | POA: Diagnosis not present

## 2021-02-07 DIAGNOSIS — E291 Testicular hypofunction: Secondary | ICD-10-CM | POA: Diagnosis not present

## 2021-02-07 DIAGNOSIS — E1169 Type 2 diabetes mellitus with other specified complication: Secondary | ICD-10-CM | POA: Diagnosis not present

## 2021-02-07 DIAGNOSIS — I10 Essential (primary) hypertension: Secondary | ICD-10-CM | POA: Diagnosis not present

## 2021-02-07 DIAGNOSIS — M8580 Other specified disorders of bone density and structure, unspecified site: Secondary | ICD-10-CM | POA: Diagnosis not present

## 2021-02-07 DIAGNOSIS — R413 Other amnesia: Secondary | ICD-10-CM | POA: Diagnosis not present

## 2021-02-07 DIAGNOSIS — E785 Hyperlipidemia, unspecified: Secondary | ICD-10-CM | POA: Diagnosis not present

## 2021-04-17 DIAGNOSIS — M7022 Olecranon bursitis, left elbow: Secondary | ICD-10-CM | POA: Diagnosis not present

## 2021-04-28 DIAGNOSIS — M7022 Olecranon bursitis, left elbow: Secondary | ICD-10-CM | POA: Diagnosis not present

## 2021-06-26 IMAGING — MR MR HEAD W/O CM
10 series · 48 of 48 positions shown · non-contrast
Comparison: None.

CLINICAL DATA: 84-year-old male with memory loss.

EXAM:
MRI HEAD WITHOUT CONTRAST
TECHNIQUE: Multiplanar, multiecho pulse sequences of the brain and surrounding
structures were obtained without intravenous contrast.

[Series 2: T1 · sagittal · 5.0mm · 0.45mm/px · 2 of 21 slices shown]
[im 1/21]
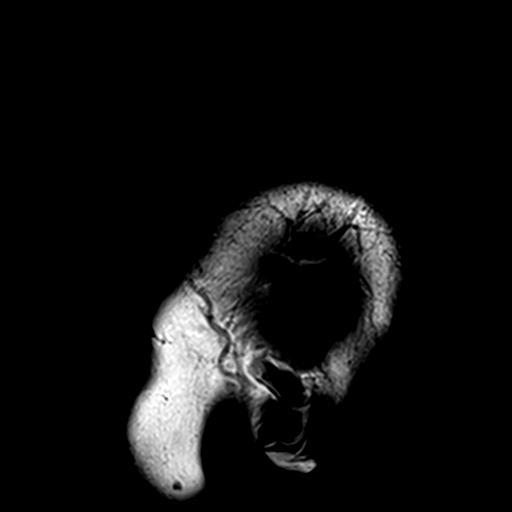
[im 21/21]
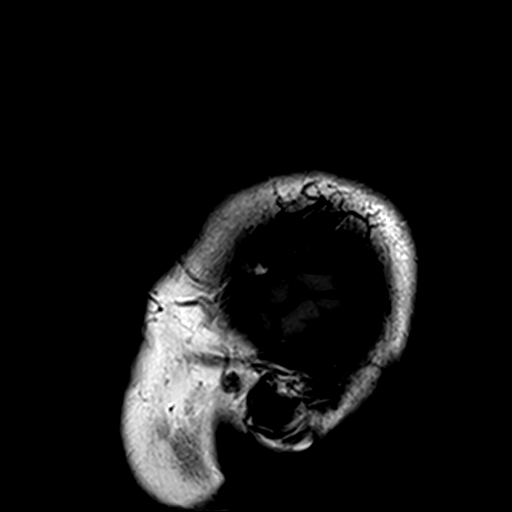

[Series 3: DWI · axial · 3.0mm · 1.80mm/px · z∈[-93,+59]mm · 9 of 104 slices shown (1 of 4)]
[im 1/104]
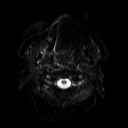
[im 13/104]
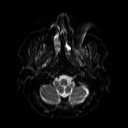
[im 26/104]
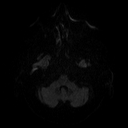
[im 39/104]
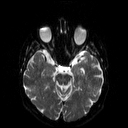
[im 52/104]
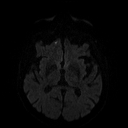
[im 65/104]
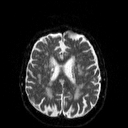
[im 78/104]
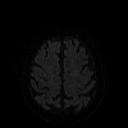
[im 91/104]
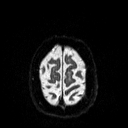
[im 104/104]
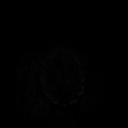

[Series 4: DWI · axial · 3.0mm · 1.80mm/px · z∈[-93,+59]mm · 4 of 51 slices shown (2 of 4)]
[im 1/51]
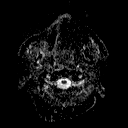
[im 17/51]
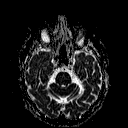
[im 34/51]
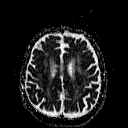
[im 51/51]
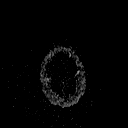

[Series 5: DWI · coronal · 5.0mm · 1.80mm/px · 6 of 68 slices shown (3 of 4)]
[im 1/68]
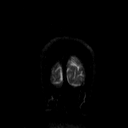
[im 14/68]
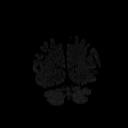
[im 27/68]
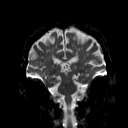
[im 41/68]
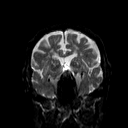
[im 54/68]
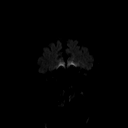
[im 68/68]
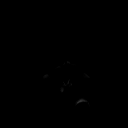

[Series 6: DWI · coronal · 5.0mm · 1.80mm/px · 3 of 34 slices shown (4 of 4)]
[im 1/34]
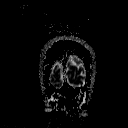
[im 17/34]
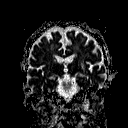
[im 34/34]
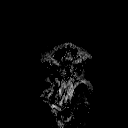

[Series 7: T2 · axial · 5.0mm · 0.51mm/px · z∈[-95,+65]mm · 2 of 24 slices shown (1 of 2)]
[im 1/24]
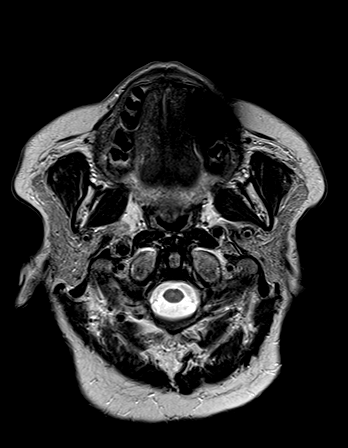
[im 24/24]
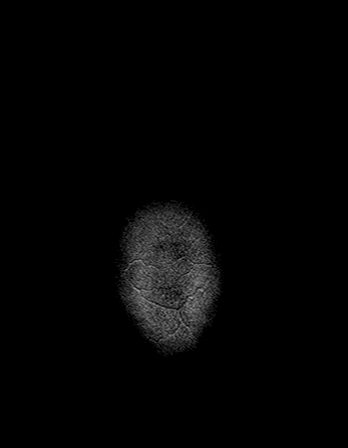

[Series 8: FLAIR · axial · 3.0mm · 0.45mm/px · z∈[-91,+57]mm · 3 of 33 slices shown]
[im 1/33]
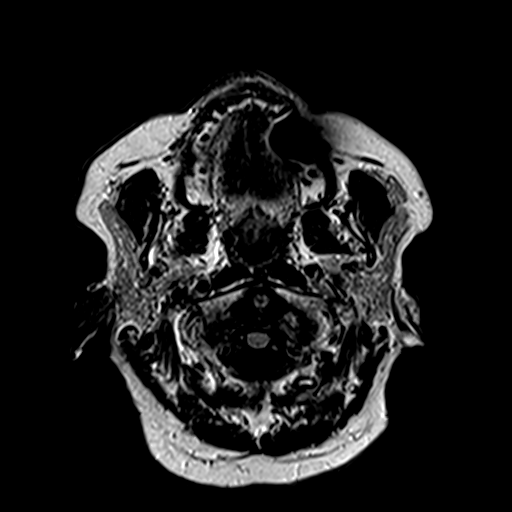
[im 17/33]
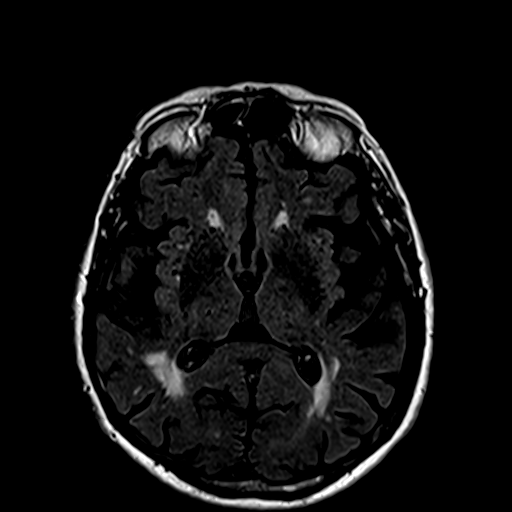
[im 33/33]
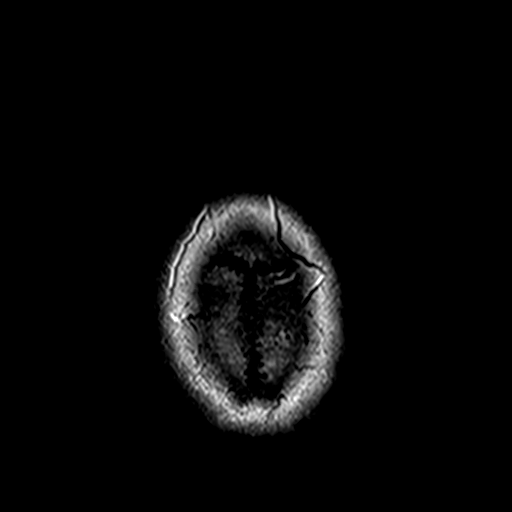

[Series 10: swi_images · axial · 4.0mm · 0.90mm/px · z∈[-94,+61]mm · 4 of 40 slices shown]
[im 1/40]
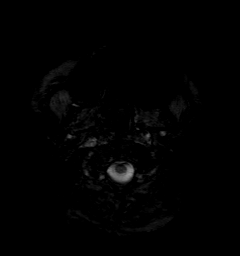
[im 14/40]
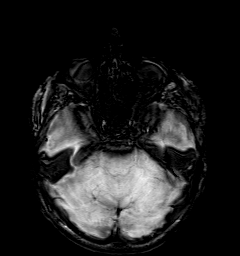
[im 27/40]
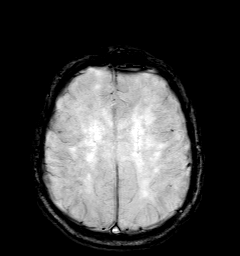
[im 40/40]
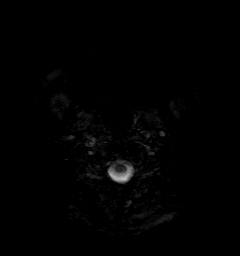

[Series 11: t1_mpr_tra · axial · 1.0mm · 0.71mm/px · z∈[-86,+56]mm · 13 of 144 slices shown]
[im 1/144]
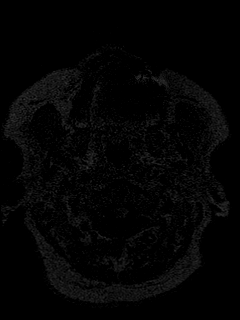
[im 12/144]
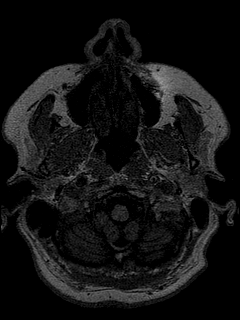
[im 24/144]
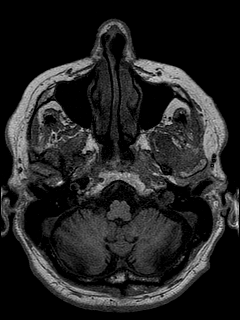
[im 36/144]
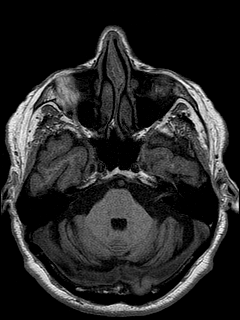
[im 48/144]
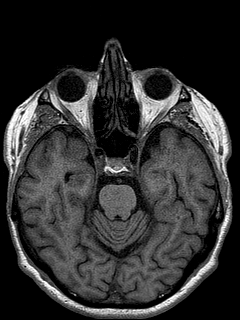
[im 60/144]
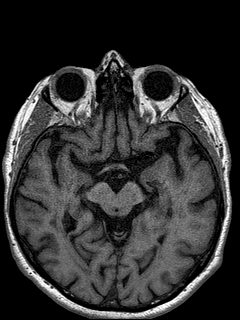
[im 72/144]
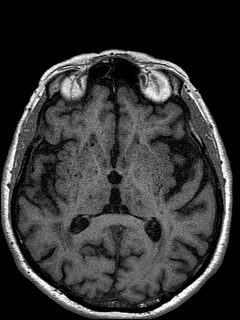
[im 84/144]
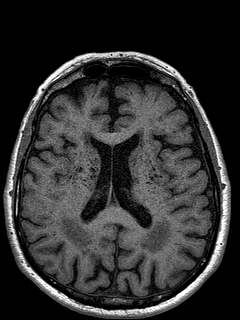
[im 96/144]
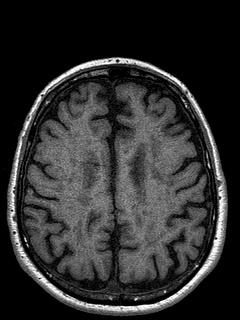
[im 108/144]
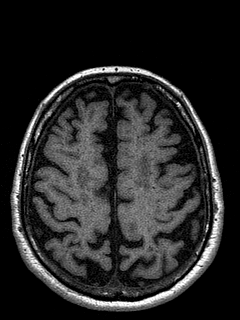
[im 120/144]
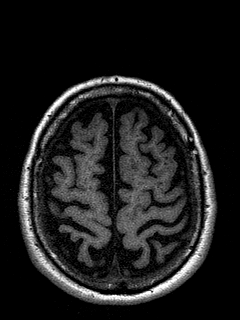
[im 132/144]
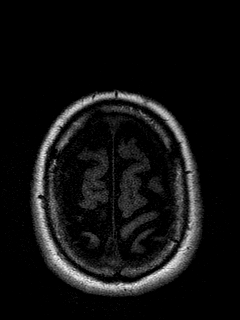
[im 144/144]
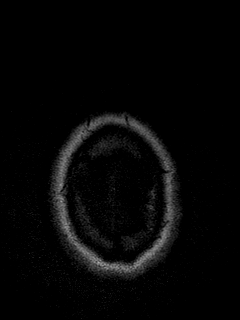

[Series 12: T2 · coronal · 5.0mm · 0.45mm/px · 2 of 25 slices shown (2 of 2)]
[im 1/25]
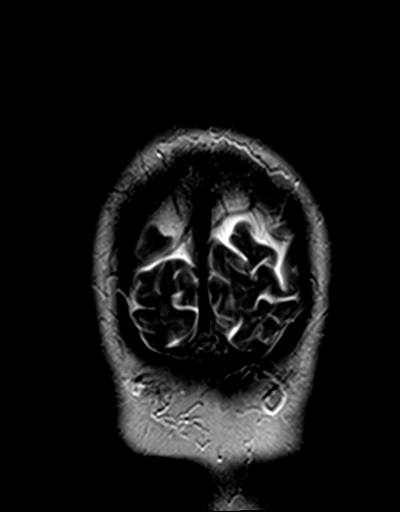
[im 25/25]
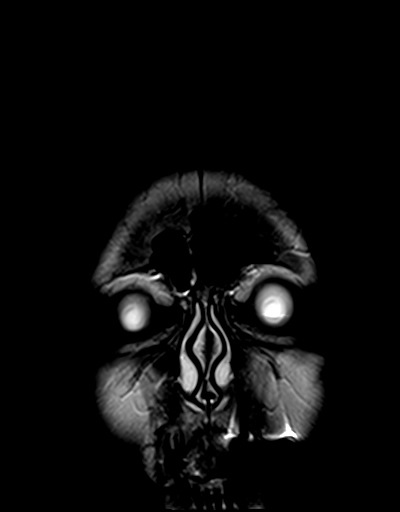

[48 of 48 positions shown; findings below may reference images not displayed]

FINDINGS: Brain: No convincing restricted diffusion.

Severe T2 signal heterogeneity throughout the basal ganglia (series
7, image 14) appears most compatible with extensive dilated
perivascular spaces on FLAIR imaging. Moderate similar changes in
the thalami. Widely scattered and confluent T2 and FLAIR
hyperintensity in the bilateral cerebral white matter. Brainstem and
cerebellum are relatively spared. No cortical encephalomalacia
identified. No definite chronic cerebral blood products.

Overall cerebral volume is within normal limits for age, with no
areas of disproportionate cerebral atrophy identified.

No midline shift, mass effect, evidence of mass lesion,
ventriculomegaly, extra-axial collection or acute intracranial
hemorrhage. Cervicomedullary junction and pituitary are within
normal limits.

Vascular: Major intracranial vascular flow voids are preserved.
There is mild anterior circulation ectasia.

Skull and upper cervical spine: Negative for age visible cervical
spine, bone marrow signal.

Sinuses/Orbits: Postoperative changes to both globes, otherwise
negative orbits. Paranasal sinuses and mastoids are well
pneumatized.

Other: Visible internal auditory structures appear normal. Scalp and
face soft tissues appear negative.
IMPRESSION: 1.  No acute intracranial abnormality.
2. Extensive signal changes in the cerebral white matter and the
deep gray matter nuclei compatible with chronic hypertensive/small
vessel disease.

## 2021-07-29 DIAGNOSIS — H401332 Pigmentary glaucoma, bilateral, moderate stage: Secondary | ICD-10-CM | POA: Diagnosis not present

## 2021-08-11 DIAGNOSIS — D2271 Melanocytic nevi of right lower limb, including hip: Secondary | ICD-10-CM | POA: Diagnosis not present

## 2021-08-11 DIAGNOSIS — L821 Other seborrheic keratosis: Secondary | ICD-10-CM | POA: Diagnosis not present

## 2021-08-11 DIAGNOSIS — Z85828 Personal history of other malignant neoplasm of skin: Secondary | ICD-10-CM | POA: Diagnosis not present

## 2021-08-11 DIAGNOSIS — Z85820 Personal history of malignant melanoma of skin: Secondary | ICD-10-CM | POA: Diagnosis not present

## 2021-08-11 DIAGNOSIS — D1801 Hemangioma of skin and subcutaneous tissue: Secondary | ICD-10-CM | POA: Diagnosis not present

## 2021-08-11 DIAGNOSIS — L570 Actinic keratosis: Secondary | ICD-10-CM | POA: Diagnosis not present

## 2021-08-11 DIAGNOSIS — L308 Other specified dermatitis: Secondary | ICD-10-CM | POA: Diagnosis not present

## 2021-08-11 DIAGNOSIS — L814 Other melanin hyperpigmentation: Secondary | ICD-10-CM | POA: Diagnosis not present

## 2021-08-11 DIAGNOSIS — D225 Melanocytic nevi of trunk: Secondary | ICD-10-CM | POA: Diagnosis not present

## 2021-08-28 ENCOUNTER — Encounter: Payer: Self-pay | Admitting: Cardiovascular Disease

## 2021-08-28 NOTE — Progress Notes (Signed)
This encounter was created in error - please disregard.

## 2021-08-29 ENCOUNTER — Encounter: Payer: Medicare HMO | Admitting: Cardiovascular Disease

## 2021-09-26 DIAGNOSIS — R5383 Other fatigue: Secondary | ICD-10-CM | POA: Diagnosis not present

## 2021-09-26 DIAGNOSIS — E538 Deficiency of other specified B group vitamins: Secondary | ICD-10-CM | POA: Diagnosis not present

## 2021-09-26 DIAGNOSIS — R7301 Impaired fasting glucose: Secondary | ICD-10-CM | POA: Diagnosis not present

## 2021-09-26 DIAGNOSIS — I10 Essential (primary) hypertension: Secondary | ICD-10-CM | POA: Diagnosis not present

## 2021-09-26 DIAGNOSIS — E559 Vitamin D deficiency, unspecified: Secondary | ICD-10-CM | POA: Diagnosis not present

## 2021-09-26 DIAGNOSIS — R7989 Other specified abnormal findings of blood chemistry: Secondary | ICD-10-CM | POA: Diagnosis not present

## 2021-09-26 DIAGNOSIS — E785 Hyperlipidemia, unspecified: Secondary | ICD-10-CM | POA: Diagnosis not present

## 2021-09-26 DIAGNOSIS — E291 Testicular hypofunction: Secondary | ICD-10-CM | POA: Diagnosis not present

## 2021-09-26 DIAGNOSIS — Z125 Encounter for screening for malignant neoplasm of prostate: Secondary | ICD-10-CM | POA: Diagnosis not present

## 2021-09-30 DIAGNOSIS — R350 Frequency of micturition: Secondary | ICD-10-CM | POA: Diagnosis not present

## 2021-09-30 DIAGNOSIS — E785 Hyperlipidemia, unspecified: Secondary | ICD-10-CM | POA: Diagnosis not present

## 2021-09-30 DIAGNOSIS — E1169 Type 2 diabetes mellitus with other specified complication: Secondary | ICD-10-CM | POA: Diagnosis not present

## 2021-09-30 DIAGNOSIS — I959 Hypotension, unspecified: Secondary | ICD-10-CM | POA: Diagnosis not present

## 2021-09-30 DIAGNOSIS — R413 Other amnesia: Secondary | ICD-10-CM | POA: Diagnosis not present

## 2021-09-30 DIAGNOSIS — E860 Dehydration: Secondary | ICD-10-CM | POA: Diagnosis not present

## 2021-09-30 DIAGNOSIS — R5383 Other fatigue: Secondary | ICD-10-CM | POA: Diagnosis not present

## 2021-09-30 DIAGNOSIS — I10 Essential (primary) hypertension: Secondary | ICD-10-CM | POA: Diagnosis not present

## 2021-10-03 DIAGNOSIS — E785 Hyperlipidemia, unspecified: Secondary | ICD-10-CM | POA: Diagnosis not present

## 2021-10-03 DIAGNOSIS — R7301 Impaired fasting glucose: Secondary | ICD-10-CM | POA: Diagnosis not present

## 2021-10-03 DIAGNOSIS — Z0000 Encounter for general adult medical examination without abnormal findings: Secondary | ICD-10-CM | POA: Diagnosis not present

## 2021-10-03 DIAGNOSIS — R82998 Other abnormal findings in urine: Secondary | ICD-10-CM | POA: Diagnosis not present

## 2021-10-03 DIAGNOSIS — Z1339 Encounter for screening examination for other mental health and behavioral disorders: Secondary | ICD-10-CM | POA: Diagnosis not present

## 2021-10-03 DIAGNOSIS — R0609 Other forms of dyspnea: Secondary | ICD-10-CM | POA: Diagnosis not present

## 2021-10-03 DIAGNOSIS — E1169 Type 2 diabetes mellitus with other specified complication: Secondary | ICD-10-CM | POA: Diagnosis not present

## 2021-10-03 DIAGNOSIS — I10 Essential (primary) hypertension: Secondary | ICD-10-CM | POA: Diagnosis not present

## 2021-10-03 DIAGNOSIS — Z23 Encounter for immunization: Secondary | ICD-10-CM | POA: Diagnosis not present

## 2021-10-03 DIAGNOSIS — M8580 Other specified disorders of bone density and structure, unspecified site: Secondary | ICD-10-CM | POA: Diagnosis not present

## 2021-10-03 DIAGNOSIS — R413 Other amnesia: Secondary | ICD-10-CM | POA: Diagnosis not present

## 2021-10-03 DIAGNOSIS — N3281 Overactive bladder: Secondary | ICD-10-CM | POA: Diagnosis not present

## 2021-10-03 DIAGNOSIS — Z1331 Encounter for screening for depression: Secondary | ICD-10-CM | POA: Diagnosis not present

## 2021-10-15 ENCOUNTER — Ambulatory Visit: Payer: Medicare HMO | Admitting: Cardiovascular Disease

## 2021-10-15 ENCOUNTER — Encounter: Payer: Self-pay | Admitting: Cardiovascular Disease

## 2021-10-15 VITALS — BP 121/80 | HR 54 | Ht 67.5 in | Wt 144.2 lb

## 2021-10-15 DIAGNOSIS — I10 Essential (primary) hypertension: Secondary | ICD-10-CM | POA: Diagnosis not present

## 2021-10-15 DIAGNOSIS — R0609 Other forms of dyspnea: Secondary | ICD-10-CM

## 2021-10-15 DIAGNOSIS — E782 Mixed hyperlipidemia: Secondary | ICD-10-CM | POA: Diagnosis not present

## 2021-10-15 NOTE — Patient Instructions (Signed)
Medication Instructions:  ° °Your physician recommends that you continue on your current medications as directed. Please refer to the Current Medication list given to you today. ° °*If you need a refill on your cardiac medications before your next appointment, please call your pharmacy* ° ° ° °Follow-Up: °At [facility], you and your health needs are our priority.  As part of our continuing mission to provide you with exceptional heart care, we have created designated Provider Care Teams.  These Care Teams include your primary Cardiologist (physician) and Advanced Practice Providers (APPs -  Physician Assistants and Nurse Practitioners) who all work together to provide you with the care you need, when you need it. ° °We recommend signing up for the patient portal called "MyChart".  Sign up information is provided on this After Visit Summary.  MyChart is used to connect with patients for Virtual Visits (Telemedicine).  Patients are able to view lab/test results, encounter notes, upcoming appointments, etc.  Non-urgent messages can be sent to your provider as well.   °To learn more about what you can do with MyChart, go to [email or web address].   ° °Your next appointment:   °1 year(s) ° °The format for your next appointment:   °In Person ° °Provider:   °Philip Nahser, MD { ° ° °

## 2021-10-15 NOTE — Progress Notes (Signed)
Cardiology Office Note:    Date:  10/15/2021   ID:  Anthony Allison, DOB 1935/09/15, MRN [id number]  PCP:  Crist Infante, MD  Cardiologist:  Mertie Moores, MD    Referring MD: Crist Infante, MD   Problem list 1. Hypertension 2. Hyperlipidemia 3. Right bundle branch block 4. Dyspnea on exertion  Chief Complaint  Patient presents with   Shortness of Breath         Previous notes   Anthony Allison is a 86 y.o. male with a hx of  HTN, hyperlipidemia ,  I saw Anthony Allison many years ago He recently went to a class reunion and talked to a classmate who had had a heart attack. He became concerned about some of his DOE Denies any CP .   Has some dyspnea when he mows the lawn - 45 min - 1 hr.  Push mower weed eats, blows grass .   Does not have to stop .   Previous pipe smoker - 20 years ago   Jun 27, 2020: Anthony Allison is seen today for follow up of his HTN, HLD. Has had DOE with very vigorous activity in the past.   Has seemed to be more physiologic than a true issue  He was seen remotely in 2020 due to Covid  Continues to have short winded.   Gets winded when he is out working in the garden  We have done a GXT in the past  - was normal by his memory    August 29, 2021 No show:   Aug. 23, 2023 Anthony Allison is seen today for follow up of his HTN, HLD Hs DOE with vigorous activity  Like Lexiscan Myoview study from August 27, 2020 was low risk with no evidence of ischemia.  GXT was nondiagnostic.  He walked on a standard Bruce protocol treadmill test for 8 minutes.  He achieved a peak heart rate of 96 which was only 70% of predicted maximal heart rate.  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   He has disucssed this dyspnea with Dr. Joylene Draft His dyspnea   Past Medical History:  Diagnosis Date   Colon polyps    ADENOMA   Erectile dysfunction of organic origin    Hemorrhoids 2006   S/P  PROCEDURE   Hyperlipidemia 2001   Hypertension 1999   ON MEDS   Hypothyroidism    Impaired fasting glucose 2005   Prostatic hypertrophy    HX OF, BENIGN   Shingles 2006   ON BACK   Vitamin D deficiency     Past Surgical History:  Procedure Laterality Date   EYE SURGERY Bilateral 2015 OR 2016   CATARACTS REMOVED   GLAUCOMA SURGERY  2015    Current Medications: Current Meds  Medication Sig   finasteride (PROSCAR) 5 MG tablet Take 5 mg by mouth daily.   galantamine (RAZADYNE ER) 16 MG 24 hr capsule Take 16 mg by mouth daily.   GNP VITAMIN D3 EXTRA STRENGTH 25 MCG (1000 UT) tablet Take 1,000 Units by mouth daily.   ibandronate (BONIVA) 150 MG tablet Take 150 mg by mouth every 30 (thirty) days.   lisinopril (PRINIVIL,ZESTRIL) 40 MG tablet Take 40 mg by mouth daily.   MYRBETRIQ 50 MG TB24 tablet Take 50 mg by mouth daily.   Omega-3 Fatty Acids (FISH OIL) 1000 MG CAPS Take 2 capsules by mouth daily.   potassium chloride (  MICRO-K) 10 MEQ CR capsule Take by mouth.   simvastatin (ZOCOR) 20 MG tablet SMARTSIG:1 Tablet(s) By Mouth Every Evening   Travoprost, BAK Free, (TRAVATAN) 0.004 % SOLN ophthalmic solution Place 1 drop into both eyes at bedtime.    vitamin B-12 (CYANOCOBALAMIN) 1000 MCG tablet Take 1,000 mcg by mouth daily.     Allergies:   Patient has no known allergies.   Social History   Socioeconomic History   Marital status: Married    Spouse name: Not on file   Number of children: 3   Years of education: Not on file   Highest education level: Not on file  Occupational History   Occupation: ARCHITECT  Tobacco Use   Smoking status: Former   Smokeless tobacco: Never  Scientific laboratory technician Use: Never used  Substance and Sexual Activity   Alcohol use: Yes   Drug use: No   Sexual activity: Not on file  Other Topics Concern   Not on file  Social History Narrative   Not on file   Social Determinants of Health   Financial Resource Strain: Not on file  Food  Insecurity: Not on file  Transportation Needs: Not on file  Physical Activity: Not on file  Stress: Not on file  Social Connections: Not on file     Family History: The patient's family history includes CAD in his father; CVA in his mother; Stroke in his mother. ROS:   Please see the history of present illness.     All other systems reviewed and are negative.  EKGs/Labs/Other Studies Reviewed:    The following studies were reviewed today:     Recent Labs: 10/27/2020: ALT 20; BUN 26; Creatinine, Ser 1.46; Hemoglobin 14.9; Platelets 175; Potassium 3.7; Sodium 139  Recent Lipid Panel No results found for: "CHOL", "TRIG", "HDL", "CHOLHDL", "VLDL", "LDLCALC", "LDLDIRECT"  Physical Exam:     Physical Exam: Blood pressure 121/80, pulse (!) 54, height 5' 7.5" (1.715 m), weight 144 lb 3.2 oz (65.4 kg), SpO2 96 %.  GEN:  Well nourished, well developed in no acute distress HEENT: Normal NECK: No JVD; No carotid bruits LYMPHATICS: No lymphadenopathy CARDIAC: RRR , no murmurs, rubs, gallops RESPIRATORY:  Clear to auscultation without rales, wheezing or rhonchi  ABDOMEN: Soft, non-tender, non-distended MUSCULOSKELETAL:  No edema; No deformity  SKIN: Warm and dry NEUROLOGIC:  Alert and oriented x 3   EKG:     aug. 23, 2023:   Sinus brady at 54.   RBBB    ASSESSMENT:    1. Essential hypertension   2. Mixed hyperlipidemia   3. DOE (dyspnea on exertion)     PLAN:     1. Hypertension -    BP is well controlled.   2. Hyperlipidemia -   managed by dr .Joylene Draft  3. Right bundle branch block -  stable   4. Dyspnea on exertion -  he does not think that he is very limited.  He now does what he wants to do.  Overall, I agree that he is doing well for an 86 yo man.      Medication Adjustments/Labs and Tests Ordered: Current medicines are reviewed at length with the patient today.  Concerns regarding medicines are outlined above.  Orders Placed This Encounter  Procedures   EKG  12-Lead   No orders of the defined types were placed in this encounter.    Signed, Mertie Moores, MD  10/15/2021 7:28 PM    [place]

## 2021-11-07 DIAGNOSIS — R300 Dysuria: Secondary | ICD-10-CM | POA: Diagnosis not present

## 2021-12-01 DIAGNOSIS — E1169 Type 2 diabetes mellitus with other specified complication: Secondary | ICD-10-CM | POA: Diagnosis not present

## 2021-12-01 DIAGNOSIS — Z23 Encounter for immunization: Secondary | ICD-10-CM | POA: Diagnosis not present

## 2021-12-01 DIAGNOSIS — I10 Essential (primary) hypertension: Secondary | ICD-10-CM | POA: Diagnosis not present

## 2022-02-02 DIAGNOSIS — H5203 Hypermetropia, bilateral: Secondary | ICD-10-CM | POA: Diagnosis not present

## 2022-02-02 DIAGNOSIS — H401331 Pigmentary glaucoma, bilateral, mild stage: Secondary | ICD-10-CM | POA: Diagnosis not present

## 2022-04-29 DIAGNOSIS — N401 Enlarged prostate with lower urinary tract symptoms: Secondary | ICD-10-CM | POA: Diagnosis not present

## 2022-04-29 DIAGNOSIS — E785 Hyperlipidemia, unspecified: Secondary | ICD-10-CM | POA: Diagnosis not present

## 2022-04-29 DIAGNOSIS — I10 Essential (primary) hypertension: Secondary | ICD-10-CM | POA: Diagnosis not present

## 2022-04-29 DIAGNOSIS — N3281 Overactive bladder: Secondary | ICD-10-CM | POA: Diagnosis not present

## 2022-04-29 DIAGNOSIS — E1169 Type 2 diabetes mellitus with other specified complication: Secondary | ICD-10-CM | POA: Diagnosis not present

## 2022-04-29 DIAGNOSIS — F03A0 Unspecified dementia, mild, without behavioral disturbance, psychotic disturbance, mood disturbance, and anxiety: Secondary | ICD-10-CM | POA: Diagnosis not present

## 2022-04-29 DIAGNOSIS — R609 Edema, unspecified: Secondary | ICD-10-CM | POA: Diagnosis not present

## 2022-05-12 ENCOUNTER — Other Ambulatory Visit (HOSPITAL_COMMUNITY): Payer: Self-pay | Admitting: Internal Medicine

## 2022-05-12 DIAGNOSIS — R609 Edema, unspecified: Secondary | ICD-10-CM

## 2022-05-13 ENCOUNTER — Ambulatory Visit (HOSPITAL_COMMUNITY)
Admission: RE | Admit: 2022-05-13 | Discharge: 2022-05-13 | Disposition: A | Discharge status: Home / Self Care | Payer: Medicare HMO | Source: Ambulatory Visit | Attending: Internal Medicine | Admitting: Internal Medicine

## 2022-05-13 DIAGNOSIS — R609 Edema, unspecified: Secondary | ICD-10-CM | POA: Diagnosis not present

## 2022-05-13 NOTE — Progress Notes (Signed)
Bilateral lower extremity venous duplex has been completed. Preliminary results can be found in CV Proc through chart review.  Results were given to [facility] at Dr. Silvestre Mesi office.  05/13/22 3:25 PM Carlos Levering RVT

## 2022-05-28 DIAGNOSIS — R300 Dysuria: Secondary | ICD-10-CM | POA: Diagnosis not present

## 2022-06-04 DIAGNOSIS — H401331 Pigmentary glaucoma, bilateral, mild stage: Secondary | ICD-10-CM | POA: Diagnosis not present

## 2022-06-04 DIAGNOSIS — Z961 Presence of intraocular lens: Secondary | ICD-10-CM | POA: Diagnosis not present

## 2022-06-22 DIAGNOSIS — N471 Phimosis: Secondary | ICD-10-CM | POA: Diagnosis not present

## 2022-06-22 DIAGNOSIS — N3941 Urge incontinence: Secondary | ICD-10-CM | POA: Diagnosis not present

## 2022-06-22 DIAGNOSIS — N401 Enlarged prostate with lower urinary tract symptoms: Secondary | ICD-10-CM | POA: Diagnosis not present

## 2022-06-23 ENCOUNTER — Other Ambulatory Visit: Payer: Self-pay | Admitting: Urology

## 2022-07-08 ENCOUNTER — Other Ambulatory Visit: Payer: Self-pay

## 2022-07-08 ENCOUNTER — Encounter (HOSPITAL_BASED_OUTPATIENT_CLINIC_OR_DEPARTMENT_OTHER): Payer: Self-pay | Admitting: Urology

## 2022-07-08 NOTE — Progress Notes (Signed)
Spoke w/ via phone for pre-op interview---pt Lab needs dos----I stat               Lab results------EKG 10-15-2021 epic, lov cardiology dr Melburn Popper 10-15-2021 epic COVID test -----patient states asymptomatic no test needed Arrive at -------1015 am 07-21-2022 NPO after MN NO Solid Food.  Clear liquids from MN until---915 am Med rec completed Medications to take morning of surgery -----proscar, amlodipine Diabetic medication -----n/a Patient instructed no nail polish to be worn day of surgery Patient instructed to bring photo id and insurance card day of surgery Patient aware to have Driver (ride ) / caregiver  son Anthony Allison   for 24 hours after surgery  Patient Special Instructions -----none Pre-Op special Instructions -----none Patient verbalized understanding of instructions that were given at this phone interview. Patient denies shortness of breath, chest pain, fever, cough at this phone interview.

## 2022-07-17 NOTE — H&P (Signed)
Patient is a 87 year old white male seen today for a couple of issues. First he has had significant issues with some urgency and recently urge incontinence over the last few years. He is now to the point where he is wearing depends. He states his force of stream is good. He has been on BPH medicine with finasteride 5 mg daily and also is on urgency medicine with Myrbetriq over the last couple of months. Thinks this may have helped urgency somewhat. Denies any dysuria or hematuria. No family history of prostate cancer.  Micro urinalysis today showed rare bacteria otherwise clear. Postvoid residual is measured at 5 cc  Second issue is that of phimosis. Patient has been unable to retract foreskin over the last 2 to 3 years and has gotten worse. States it is extremely tender to try to retract the foreskin and cannot adequately clean under the foreskin now.     ALLERGIES: No Allergies    MEDICATIONS: Amlodipine Besylate 5 mg tablet 1 tablet PO Daily  B-12 1,000 mcg tablet 1 tablet PO Daily  Finasteride 5 mg tablet 1 tablet PO Daily  Fish Oil 1,200 mg (144 mg-216 mg) capsule 1 capsule PO Daily  Galantamine Er 16 mg capsule, extended release pellets 24 hr 1 capsule PO Daily  Latanoprost 0.005 % drops 1 PO Daily  Lisinopril 40 mg tablet 1 tablet PO Daily  Myrbetriq 50 mg tablet, extended release 24 hr 1 tablet PO Daily  Simvastatin 20 mg tablet 1 tablet PO Daily  Travoprost 0.004 % drops 1 PO Daily  Vitamin D3 25 mcg (1,000 unit) tablet 1 tablet PO Daily     GU PSH: None   NON-GU PSH: Eye Surgery (Unspecified) Tonsillectomy     GU PMH: None   NON-GU PMH: Glaucoma Hypercholesterolemia Hypertension    FAMILY HISTORY: Heart Attack - Father Strokes - Mother   SOCIAL HISTORY: Marital Status: Married Ethnicity: Not Hispanic Or Latino; Race: White Current Smoking Status: Patient does not smoke anymore. Has not smoked since 05/24/1957.   Tobacco Use Assessment Completed: Used Tobacco in last  30 days? Does drink.  Does not drink caffeine.    REVIEW OF SYSTEMS:    GU Review Male:   Patient reports frequent urination, hard to postpone urination, burning/ pain with urination, get up at night to urinate, leakage of urine, erection problems, and penile pain. Patient denies stream starts and stops, trouble starting your stream, and have to strain to urinate .  Gastrointestinal (Upper):   Patient denies nausea, vomiting, and indigestion/ heartburn.  Gastrointestinal (Lower):   Patient denies diarrhea and constipation.  Constitutional:   Patient denies fever, night sweats, weight loss, and fatigue.  Skin:   Patient denies skin rash/ lesion and itching.  Eyes:   Patient denies blurred vision and double vision.  Ears/ Nose/ Throat:   Patient denies sore throat and sinus problems.  Hematologic/Lymphatic:   Patient denies swollen glands and easy bruising.  Cardiovascular:   Patient reports leg swelling. Patient denies chest pains.  Respiratory:   Patient reports shortness of breath. Patient denies cough.  Endocrine:   Patient denies excessive thirst.  Musculoskeletal:   Patient denies back pain and joint pain.  Neurological:   Patient denies headaches and dizziness.  Psychologic:   Patient denies depression and anxiety.   VITAL SIGNS:      06/22/2022 02:32 PM  Weight 150 lb / 68.04 kg  Height 67 in / 170.18 cm  BP 124/70 mmHg  Pulse 55 /min  Temperature 98.0 F / 36.6 C  BMI 23.5 kg/m   GU PHYSICAL EXAMINATION:    Anus and Perineum: No hemorrhoids. No anal stenosis. No rectal fissure, no anal fissure. No edema, no dimple, no perineal tenderness, no anal tenderness.  Penis: Penis uncircumcised. No foreskin warts, no cracks. No dorsal peyronie's plaques, no left corporal peyronie's plaques, no right corporal peyronie's plaques, no scarring, no shaft warts. No balanitis, foreskin cannot be retracted as tight phimosis  Prostate: Prostate 2 + size. Left lobe normal consistency, right lobe  normal consistency. Symmetrical lobes. No prostate nodule. Left lobe no tenderness, right lobe no tenderness.    MULTI-SYSTEM PHYSICAL EXAMINATION:    Constitutional: Well-nourished. No physical deformities. Normally developed. Good grooming.  Neck: Neck symmetrical, not swollen. Normal tracheal position.  Respiratory: No labored breathing, no use of accessory muscles.   Cardiovascular: Normal temperature, normal extremity pulses, no swelling, no varicosities.  Lymphatic: No enlargement of neck, axillae, groin.  Skin: No paleness, no jaundice, no cyanosis. No lesion, no ulcer, no rash.  Neurologic / Psychiatric: Oriented to time, oriented to place, oriented to person. No depression, no anxiety, no agitation.  Eyes: Normal conjunctivae. Normal eyelids.  Ears, Nose, Mouth, and Throat: Left ear no scars, no lesions, no masses. Right ear no scars, no lesions, no masses. Nose no scars, no lesions, no masses. Normal hearing. Normal lips.  Musculoskeletal: Normal gait and station of head and neck.     Complexity of Data:  Source Of History:  Patient  Records Review:   Previous Doctor Records, Previous Patient Records  Urine Test Review:   Urinalysis   PROCEDURES:         PVR Ultrasound - 16109  Scanned Volume: 5 cc         Urinalysis w/Scope - 81001 Dipstick Dipstick Cont'd Micro  Color: Yellow Bilirubin: Neg mg/dL WBC/hpf: 0 - 5/hpf  Appearance: Slightly Cloudy Ketones: Neg mg/dL RBC/hpf: NS (Not Seen)  Specific Gravity: 1.020 Blood: Neg ery/uL Bacteria: Few (10-25/hpf)  pH: 6.0 Protein: Neg mg/dL Cystals: NS (Not Seen)  Glucose: Neg mg/dL Urobilinogen: 0.2 mg/dL Casts: Hyaline    Nitrites: Neg Trichomonas: Not Present    Leukocyte Esterase: Trace leu/uL Mucous: Present      Epithelial Cells: NS (Not Seen)      Yeast: NS (Not Seen)      Sperm: Not Present    ASSESSMENT:      ICD-10 Details  1 GU:   Urge incontinence - N39.41 Chronic, Worsening  2   BPH w/LUTS - N40.1 Chronic,  Worsening  3   Phimosis - N47.1 Chronic, Worsening   PLAN:           Document Letter(s):  Created for Patient: Clinical Summary         Notes:   From urgent continence standpoint trial of Gemtesa 75 mg daily and lieu of the Myrbetriq, samples dispensed we will see how this does for him. Will continue on finasteride 5 mg daily.   In regards to phimosis recommended circumcision as foreskin cannot be retracted need is unable to have adequate hygiene. We discussed risk benefits procedure as outlined below. Will schedule accordingly in the near future.  Circumcision consent: I have discussed with the patient the risks and benefits of the procedure including but not limited to bleeding, infection, damage to adjacent structures including the urethra wth possible need for further procedures. Risk of numbness and decreased sensation of the penis, scarring of the penile skin and pain  associated with scarring. I have also discussed with the patient the alternatives to circumcision. Patient voices understanding of the risks and benefits of the above procedure and consents.

## 2022-07-21 ENCOUNTER — Encounter (HOSPITAL_BASED_OUTPATIENT_CLINIC_OR_DEPARTMENT_OTHER): Payer: Self-pay | Admitting: Urology

## 2022-07-21 ENCOUNTER — Other Ambulatory Visit: Payer: Self-pay

## 2022-07-21 ENCOUNTER — Ambulatory Visit (HOSPITAL_BASED_OUTPATIENT_CLINIC_OR_DEPARTMENT_OTHER): Payer: Medicare HMO | Admitting: Anesthesiology

## 2022-07-21 ENCOUNTER — Encounter (HOSPITAL_BASED_OUTPATIENT_CLINIC_OR_DEPARTMENT_OTHER)
Admission: RE | Disposition: A | Discharge status: Home / Self Care | Payer: Self-pay | Source: Home / Self Care | Attending: Urology

## 2022-07-21 ENCOUNTER — Ambulatory Visit (HOSPITAL_BASED_OUTPATIENT_CLINIC_OR_DEPARTMENT_OTHER)
Admission: RE | Admit: 2022-07-21 | Discharge: 2022-07-21 | Disposition: A | Discharge status: Home / Self Care | Payer: Medicare HMO | Attending: Urology | Admitting: Urology

## 2022-07-21 DIAGNOSIS — N3941 Urge incontinence: Secondary | ICD-10-CM | POA: Diagnosis not present

## 2022-07-21 DIAGNOSIS — Z87891 Personal history of nicotine dependence: Secondary | ICD-10-CM

## 2022-07-21 DIAGNOSIS — N471 Phimosis: Secondary | ICD-10-CM | POA: Diagnosis not present

## 2022-07-21 DIAGNOSIS — Z79899 Other long term (current) drug therapy: Secondary | ICD-10-CM | POA: Diagnosis not present

## 2022-07-21 DIAGNOSIS — I10 Essential (primary) hypertension: Secondary | ICD-10-CM | POA: Insufficient documentation

## 2022-07-21 DIAGNOSIS — N401 Enlarged prostate with lower urinary tract symptoms: Secondary | ICD-10-CM | POA: Diagnosis not present

## 2022-07-21 DIAGNOSIS — Z01812 Encounter for preprocedural laboratory examination: Secondary | ICD-10-CM

## 2022-07-21 DIAGNOSIS — N477 Other inflammatory diseases of prepuce: Secondary | ICD-10-CM | POA: Diagnosis not present

## 2022-07-21 HISTORY — DX: Phimosis: N47.1

## 2022-07-21 HISTORY — PX: CIRCUMCISION: SHX1350

## 2022-07-21 HISTORY — DX: Abnormal electrocardiogram (ECG) (EKG): R94.31

## 2022-07-21 HISTORY — DX: Unspecified glaucoma: H40.9

## 2022-07-21 LAB — POCT I-STAT, CHEM 8
BUN: 19 mg/dL (ref 8–23)
Calcium, Ion: 1.23 mmol/L (ref 1.15–1.40)
Chloride: 105 mmol/L (ref 98–111)
Creatinine, Ser: 0.9 mg/dL (ref 0.61–1.24)
Glucose, Bld: 106 mg/dL — ABNORMAL HIGH (ref 70–99)
HCT: 46 % (ref 39.0–52.0)
Hemoglobin: 15.6 g/dL (ref 13.0–17.0)
Potassium: 3.9 mmol/L (ref 3.5–5.1)
Sodium: 143 mmol/L (ref 135–145)
TCO2: 27 mmol/L (ref 22–32)

## 2022-07-21 SURGERY — CIRCUMCISION, ADULT
Anesthesia: General | Site: Penis

## 2022-07-21 MED ORDER — ONDANSETRON HCL 4 MG/2ML IJ SOLN
INTRAMUSCULAR | Status: DC | PRN
  Administered 2022-07-21: 4 mg via INTRAVENOUS

## 2022-07-21 MED ORDER — TRIPLE ANTIBIOTIC 3.5-400-5000 EX OINT
TOPICAL_OINTMENT | CUTANEOUS | Status: DC | PRN
  Administered 2022-07-21: 1 via TOPICAL

## 2022-07-21 MED ORDER — LACTATED RINGERS IV SOLN: INTRAVENOUS | Status: DC | PRN

## 2022-07-21 MED ORDER — EPHEDRINE 5 MG/ML INJ
INTRAVENOUS | Status: AC
  Filled 2022-07-21: qty 5

## 2022-07-21 MED ORDER — EPHEDRINE SULFATE (PRESSORS) 50 MG/ML IJ SOLN
INTRAMUSCULAR | Status: DC | PRN
  Administered 2022-07-21 (×2): 10 mg via INTRAVENOUS

## 2022-07-21 MED ORDER — PHENYLEPHRINE HCL (PRESSORS) 10 MG/ML IV SOLN
INTRAVENOUS | Status: DC | PRN
  Administered 2022-07-21: 80 ug via INTRAVENOUS
  Administered 2022-07-21: 160 ug via INTRAVENOUS
  Administered 2022-07-21: 80 ug via INTRAVENOUS

## 2022-07-21 MED ORDER — FENTANYL CITRATE (PF) 100 MCG/2ML IJ SOLN
INTRAMUSCULAR | Status: AC
  Filled 2022-07-21: qty 2

## 2022-07-21 MED ORDER — CEFAZOLIN SODIUM-DEXTROSE 2-4 GM/100ML-% IV SOLN
INTRAVENOUS | Status: AC
  Filled 2022-07-21: qty 100

## 2022-07-21 MED ORDER — FENTANYL CITRATE (PF) 100 MCG/2ML IJ SOLN
INTRAMUSCULAR | Status: DC | PRN
  Administered 2022-07-21: 50 ug via INTRAVENOUS
  Administered 2022-07-21: 25 ug via INTRAVENOUS

## 2022-07-21 MED ORDER — PROPOFOL 10 MG/ML IV BOLUS
INTRAVENOUS | Status: AC
  Filled 2022-07-21: qty 20

## 2022-07-21 MED ORDER — LACTATED RINGERS IV SOLN: INTRAVENOUS | Status: DC

## 2022-07-21 MED ORDER — BUPIVACAINE HCL 0.5 % IJ SOLN
INTRAMUSCULAR | Status: DC | PRN
  Administered 2022-07-21: 10 mL

## 2022-07-21 MED ORDER — ACETAMINOPHEN 500 MG PO TABS
ORAL_TABLET | ORAL | Status: AC
  Filled 2022-07-21: qty 2

## 2022-07-21 MED ORDER — DEXAMETHASONE SODIUM PHOSPHATE 10 MG/ML IJ SOLN
INTRAMUSCULAR | Status: DC | PRN
  Administered 2022-07-21: 10 mg via INTRAVENOUS

## 2022-07-21 MED ORDER — FENTANYL CITRATE (PF) 100 MCG/2ML IJ SOLN: 25.0000 ug | INTRAMUSCULAR | Status: DC | PRN

## 2022-07-21 MED ORDER — TRAMADOL HCL 50 MG PO TABS: 50.0000 mg | ORAL_TABLET | Freq: Four times a day (QID) | ORAL | 0 refills | Status: AC | PRN

## 2022-07-21 MED ORDER — CEFAZOLIN SODIUM-DEXTROSE 2-4 GM/100ML-% IV SOLN
2.0000 g | INTRAVENOUS | Status: AC
  Administered 2022-07-21: 2 g via INTRAVENOUS

## 2022-07-21 MED ORDER — PROPOFOL 10 MG/ML IV BOLUS
INTRAVENOUS | Status: DC | PRN
  Administered 2022-07-21: 150 mg via INTRAVENOUS

## 2022-07-21 MED ORDER — PHENYLEPHRINE 80 MCG/ML (10ML) SYRINGE FOR IV PUSH (FOR BLOOD PRESSURE SUPPORT)
PREFILLED_SYRINGE | INTRAVENOUS | Status: AC
  Filled 2022-07-21: qty 10

## 2022-07-21 MED ORDER — LIDOCAINE HCL (PF) 2 % IJ SOLN
INTRAMUSCULAR | Status: AC
  Filled 2022-07-21: qty 15

## 2022-07-21 MED ORDER — 0.9 % SODIUM CHLORIDE (POUR BTL) OPTIME
TOPICAL | Status: DC | PRN
  Administered 2022-07-21: 500 mL

## 2022-07-21 MED ORDER — ACETAMINOPHEN 500 MG PO TABS
1000.0000 mg | ORAL_TABLET | Freq: Once | ORAL | Status: AC
  Administered 2022-07-21: 1000 mg via ORAL

## 2022-07-21 MED ORDER — LIDOCAINE 2% (20 MG/ML) 5 ML SYRINGE
INTRAMUSCULAR | Status: DC | PRN
  Administered 2022-07-21: 40 mg via INTRAVENOUS

## 2022-07-21 SURGICAL SUPPLY — 35 items
BLADE CLIPPER SENSICLIP SURGIC (BLADE) IMPLANT
BLADE SURG 15 STRL LF DISP TIS (BLADE) ×1 IMPLANT
BLADE SURG 15 STRL SS (BLADE) ×1
BNDG CMPR 5X2 CHSV 1 LYR STRL (GAUZE/BANDAGES/DRESSINGS) ×1
BNDG CMPR 75X21 PLY HI ABS (MISCELLANEOUS) ×1
BNDG COHESIVE 2X5 TAN ST LF (GAUZE/BANDAGES/DRESSINGS) ×1 IMPLANT
CLEANER CAUTERY TIP PAD (MISCELLANEOUS) IMPLANT
COVER BACK TABLE 60X90IN (DRAPES) ×1 IMPLANT
COVER MAYO STAND STRL (DRAPES) ×1 IMPLANT
DRAPE LAPAROTOMY 100X72 PEDS (DRAPES) ×1 IMPLANT
ELECT REM PT RETURN 9FT ADLT (ELECTROSURGICAL) ×1
ELECTRODE REM PT RTRN 9FT ADLT (ELECTROSURGICAL) ×1 IMPLANT
GAUZE 4X4 16PLY ~~LOC~~+RFID DBL (SPONGE) ×1 IMPLANT
GAUZE PETROLATUM 1 X8 (GAUZE/BANDAGES/DRESSINGS) ×1 IMPLANT
GAUZE SPONGE 4X4 12PLY STRL (GAUZE/BANDAGES/DRESSINGS) ×1 IMPLANT
GAUZE STRETCH 2X75IN STRL (MISCELLANEOUS) ×1 IMPLANT
GLOVE BIO SURGEON STRL SZ7 (GLOVE) IMPLANT
GLOVE BIO SURGEON STRL SZ7.5 (GLOVE) ×1 IMPLANT
GOWN STRL REUS W/TWL XL LVL3 (GOWN DISPOSABLE) ×1 IMPLANT
KIT TURNOVER CYSTO (KITS) ×1 IMPLANT
NDL HYPO 25X1 1.5 SAFETY (NEEDLE) IMPLANT
NEEDLE HYPO 25X1 1.5 SAFETY (NEEDLE) IMPLANT
NS IRRIG 500ML POUR BTL (IV SOLUTION) ×1 IMPLANT
PACK BASIN DAY SURGERY FS (CUSTOM PROCEDURE TRAY) ×1 IMPLANT
PENCIL SMOKE EVACUATOR (MISCELLANEOUS) ×1 IMPLANT
SLEEVE SCD COMPRESS KNEE MED (STOCKING) ×1 IMPLANT
SOL PREP POV-IOD 4OZ 10% (MISCELLANEOUS) IMPLANT
SUT CHROMIC 3 0 PS 2 (SUTURE) IMPLANT
SUT CHROMIC 3 0 SH 27 (SUTURE) IMPLANT
SUT CHROMIC 4 0 RB 1X27 (SUTURE) ×1 IMPLANT
SUT CHROMIC 4 0 SH 27 (SUTURE) IMPLANT
SYR CONTROL 10ML LL (SYRINGE) IMPLANT
TOWEL OR 17X24 6PK STRL BLUE (TOWEL DISPOSABLE) ×1 IMPLANT
TRAY DSU PREP LF (CUSTOM PROCEDURE TRAY) ×1 IMPLANT
WATER STERILE IRR 500ML POUR (IV SOLUTION) ×1 IMPLANT

## 2022-07-21 NOTE — Anesthesia Preprocedure Evaluation (Addendum)
Anesthesia Evaluation  Patient identified by MRN, date of birth, ID band Patient awake    Reviewed: Allergy & Precautions, NPO status , Patient's Chart, lab work & pertinent test results  Airway Mallampati: II  TM Distance: >3 FB Neck ROM: Full    Dental  (+) Chipped, Dental Advisory Given,    Pulmonary former smoker   Pulmonary exam normal breath sounds clear to auscultation       Cardiovascular hypertension, Pt. on medications + DOE  Normal cardiovascular exam Rhythm:Regular Rate:Normal  EKG RBBB   Neuro/Psych negative neurological ROS  negative psych ROS   GI/Hepatic negative GI ROS, Neg liver ROS,,,  Endo/Other  negative endocrine ROS    Renal/GU negative Renal ROS  negative genitourinary   Musculoskeletal negative musculoskeletal ROS (+)    Abdominal   Peds  Hematology negative hematology ROS (+)   Anesthesia Other Findings   Reproductive/Obstetrics                             Anesthesia Physical Anesthesia Plan  ASA: 2  Anesthesia Plan: General   Post-op Pain Management: Tylenol PO (pre-op)*   Induction: Intravenous  PONV Risk Score and Plan: 2 and Ondansetron and Dexamethasone  Airway Management Planned: LMA  Additional Equipment:   Intra-op Plan:   Post-operative Plan: Extubation in OR  Informed Consent: I have reviewed the patients History and Physical, chart, labs and discussed the procedure including the risks, benefits and alternatives for the proposed anesthesia with the patient or authorized representative who has indicated his/her understanding and acceptance.     Dental advisory given  Plan Discussed with: CRNA  Anesthesia Plan Comments:        Anesthesia Quick Evaluation

## 2022-07-21 NOTE — Op Note (Signed)
Preoperative diagnosis:  1.  Penile phimosis  Postoperative diagnosis: 1.  Same  Procedure(s): 1.  Lysis of penile adhesions, circumcision  Surgeon: Dr. Karoline Caldwell  Anesthesia: General  Complications: None  EBL: Minimal  Specimens: Penile foreskin  Disposition of specimens: To pathology  Intraoperative findings: Dense glanular adhesions which were broken down manually and with sharp dissection.  Incision performed.  Indication: 87 year old white male has severe penile phimosis with difficulty urinating history of recurrent infections.  Presents this time undergo circumcision.  Description of procedure:  After obtaining informed consent for the patient was taken the major OR suite placed under general anesthesia.  Placed in supine position his genitalia prepped draped usual sterile fashion.  Proper pause and timeout performed.  Penile foreskin could not be retracted.  A dorsal slit was subsequently made to allow retraction of the foreskin.  Once foreskin was retracted dense penile adhesions on the glans were noted.  Some of these were able to be broken down manually a couple had to be taken down sharply to expose the coronal sulcus dorsally.  Left a fairly sizable raw area along the inferior glans margin.  Circumcising incision was then made beneath the coronal sulcus circumferentially.  Similar circumcising incision was made along the distal shaft foreskin.  The ring of skin between the 2 incisions was then sharply excised.  Hemostasis obtained with cautery.  The shaft foreskin was then sent back to the skin beneath the coronal sulcus utilizing interrupted 3 and 4-0 chromic sutures.  Care was taken to align the frenulum on ventrally.  Because of the dense adhesions there was some deformity of the glans some dorsal swelling but I did not feel like I can take down the entire adhesions near the coronal sulcus without gapping into the glans and therefore I reattached the foreskin  essentially to the cut adhesion margin along the glans.  Dorsal penile block was performed with half percent plain Marcaine termination case for postoperative anesthesia.  Nonstick dressing along with 1 inch Kerlix and 2 inch Coban dressing was placed for compression.  Antibiotic ointment was placed on the glans.  The procedure was terminated he was awakened from anesthesia and taken back to recovery in stable condition.  No immediate complication from the procedure

## 2022-07-21 NOTE — Interval H&P Note (Signed)
History and Physical Interval Note:  07/21/2022 11:48 AM  Anthony Allison  has presented today for surgery, with the diagnosis of PHIMOSIS.  The various methods of treatment have been discussed with the patient and family. After consideration of risks, benefits and other options for treatment, the patient has consented to  Procedure(s) with comments: CIRCUMCISION ADULT (N/A) - 1 HR as a surgical intervention.  The patient's history has been reviewed, patient examined, no change in status, stable for surgery.  I have reviewed the patient's chart and labs.  Questions were answered to the patient's satisfaction.     Belva Agee

## 2022-07-21 NOTE — Transfer of Care (Signed)
Immediate Anesthesia Transfer of Care Note  Patient: Anthony Allison  Procedure(s) Performed: CIRCUMCISION ADULT (Penis)  Patient Location: PACU  Anesthesia Type:General  Level of Consciousness: awake and drowsy  Airway & Oxygen Therapy: Patient Spontanous Breathing and Patient connected to nasal cannula oxygen  Post-op Assessment: Report given to RN and Post -op Vital signs reviewed and stable  Post vital signs: Reviewed and stable  Last Vitals:  Vitals Value Taken Time  BP 121/74 07/21/22 1315  Temp    Pulse 58 07/21/22 1315  Resp 14 07/21/22 1315  SpO2 96 % 07/21/22 1315  Vitals shown include unvalidated device data.  Last Pain:  Vitals:   07/21/22 1042  TempSrc: Oral  PainSc: 0-No pain      Patients Stated Pain Goal: 5 (07/21/22 1042)  Complications: No notable events documented.

## 2022-07-21 NOTE — Discharge Instructions (Addendum)
  Post Anesthesia Home Care Instructions  Activity: Get plenty of rest for the remainder of the day. A responsible individual must stay with you for 24 hours following the procedure.  For the next 24 hours, DO NOT: -Drive a car -Advertising copywriter -Drink alcoholic beverages -Take any medication unless instructed by your physician -Make any legal decisions or sign important papers.  Meals: Start with liquid foods such as gelatin or soup. Progress to regular foods as tolerated. Avoid greasy, spicy, heavy foods. If nausea and/or vomiting occur, drink only clear liquids until the nausea and/or vomiting subsides. Call your physician if vomiting continues.  Special Instructions/Symptoms: Your throat may feel dry or sore from the anesthesia or the breathing tube placed in your throat during surgery. If this causes discomfort, gargle with warm salt water. The discomfort should disappear within 24 hours.  Circumcision-Home Care Instructions  The following instructions have been prepared to help you care for yourself upon your return home today.   Wound Care & Hygiene:   You may apply ice to the penis.  This may help to decrease swelling.  Remove the dressing tomorrow.  If the dressing falls off before then, leave it off.  You may shower or bathe in 48 hours  Gently wash the penis with soap and water.  The stitches do not need to be removed.  Apply provided antibiotic ointment twice a day per physician.   Activity:  Do not drive or operate any equipment today.  The effects of anesthesia are still present, drowsiness may result.  Rest today, not necessarily flat bed rest, just take it easy.  You may resume your normal activity in one to two days or as indicated by your physician.  Sexual Activity:  Erection and sexual relations should be avoided for *2 weeks.  Return to Work:  One to two days or as indicated by your physician.  Diet:  Drink liquids or eat a very light diet this evening.  You  may resume a regular diet tomorrow.  General Expectations of your surgery:  You may have a small amount of bleeding The penis will be swollen and bruised for approximately one week You may wake during the night with an erection, usually this is caused by having a full bladder so you should try to urinate (pass your water) to relieve the erection or apply ice to the penis  Unexpected Observations - Call your doctor if these occur! Persistent or heavy bleeding Temperature of 101 degrees or more Severe pain not relieved by medication  Return to Office in 2 weeks .  Call to schedule your appointment if not already.     No acetaminophen/Tylenol until after 4:45 pm today if needed.

## 2022-07-21 NOTE — Anesthesia Procedure Notes (Signed)
Procedure Name: LMA Insertion Date/Time: 07/21/2022 12:09 PM  Performed by: Francie Massing, CRNAPre-anesthesia Checklist: Patient identified, Emergency Drugs available, Suction available and Patient being monitored Patient Re-evaluated:Patient Re-evaluated prior to induction Oxygen Delivery Method: Circle system utilized Preoxygenation: Pre-oxygenation with 100% oxygen Induction Type: IV induction Ventilation: Mask ventilation without difficulty LMA: LMA inserted LMA Size: 4.0 Number of attempts: 1 Airway Equipment and Method: Bite block Placement Confirmation: positive ETCO2 Tube secured with: Tape Dental Injury: Teeth and Oropharynx as per pre-operative assessment

## 2022-07-22 ENCOUNTER — Encounter (HOSPITAL_BASED_OUTPATIENT_CLINIC_OR_DEPARTMENT_OTHER): Payer: Self-pay | Admitting: Urology

## 2022-07-22 NOTE — Anesthesia Postprocedure Evaluation (Signed)
Anesthesia Post Note  Patient: DARQUAN HILES  Procedure(s) Performed: CIRCUMCISION ADULT (Penis)     Patient location during evaluation: PACU Anesthesia Type: General Level of consciousness: awake and alert Pain management: pain level controlled Vital Signs Assessment: post-procedure vital signs reviewed and stable Respiratory status: spontaneous breathing, nonlabored ventilation, respiratory function stable and patient connected to nasal cannula oxygen Cardiovascular status: blood pressure returned to baseline and stable Postop Assessment: no apparent nausea or vomiting Anesthetic complications: no  No notable events documented.  Last Vitals:  Vitals:   07/21/22 1345 07/21/22 1447  BP: 121/73 114/66  Pulse: (!) 54 (!) 52  Resp: 12 16  Temp:  (!) 36.1 C  SpO2: 96% 97%    Last Pain:  Vitals:   07/22/22 1121  TempSrc:   PainSc: 0-No pain   Pain Goal: Patients Stated Pain Goal: 5 (07/21/22 1042)                 Laterria Lasota L Kamarah Bilotta

## 2022-07-23 LAB — SURGICAL PATHOLOGY

## 2022-08-04 DIAGNOSIS — R350 Frequency of micturition: Secondary | ICD-10-CM | POA: Diagnosis not present

## 2022-08-04 DIAGNOSIS — N471 Phimosis: Secondary | ICD-10-CM | POA: Diagnosis not present

## 2022-08-04 DIAGNOSIS — N3941 Urge incontinence: Secondary | ICD-10-CM | POA: Diagnosis not present

## 2022-08-04 DIAGNOSIS — N401 Enlarged prostate with lower urinary tract symptoms: Secondary | ICD-10-CM | POA: Diagnosis not present

## 2022-08-13 DIAGNOSIS — L570 Actinic keratosis: Secondary | ICD-10-CM | POA: Diagnosis not present

## 2022-08-13 DIAGNOSIS — L821 Other seborrheic keratosis: Secondary | ICD-10-CM | POA: Diagnosis not present

## 2022-08-13 DIAGNOSIS — D1801 Hemangioma of skin and subcutaneous tissue: Secondary | ICD-10-CM | POA: Diagnosis not present

## 2022-08-13 DIAGNOSIS — Z85828 Personal history of other malignant neoplasm of skin: Secondary | ICD-10-CM | POA: Diagnosis not present

## 2022-08-13 DIAGNOSIS — L814 Other melanin hyperpigmentation: Secondary | ICD-10-CM | POA: Diagnosis not present

## 2022-08-13 DIAGNOSIS — L720 Epidermal cyst: Secondary | ICD-10-CM | POA: Diagnosis not present

## 2022-09-29 DIAGNOSIS — H401331 Pigmentary glaucoma, bilateral, mild stage: Secondary | ICD-10-CM | POA: Diagnosis not present

## 2022-10-06 DIAGNOSIS — R0981 Nasal congestion: Secondary | ICD-10-CM | POA: Diagnosis not present

## 2022-10-06 DIAGNOSIS — Z1152 Encounter for screening for COVID-19: Secondary | ICD-10-CM | POA: Diagnosis not present

## 2022-10-06 DIAGNOSIS — E1169 Type 2 diabetes mellitus with other specified complication: Secondary | ICD-10-CM | POA: Diagnosis not present

## 2022-10-06 DIAGNOSIS — R059 Cough, unspecified: Secondary | ICD-10-CM | POA: Diagnosis not present

## 2022-10-06 DIAGNOSIS — R5383 Other fatigue: Secondary | ICD-10-CM | POA: Diagnosis not present

## 2022-10-06 DIAGNOSIS — J069 Acute upper respiratory infection, unspecified: Secondary | ICD-10-CM | POA: Diagnosis not present

## 2022-10-22 ENCOUNTER — Encounter: Payer: Self-pay | Admitting: Cardiovascular Disease

## 2022-10-22 NOTE — Progress Notes (Signed)
Cardiology Office Note:    Date:  10/23/2022   ID:  Anthony Allison, DOB 08/24/1935, MRN [id number]  PCP:  Rodrigo Ran, MD  Cardiologist:  Kristeen Miss, MD    Referring MD: Rodrigo Ran, MD   Problem list 1. Hypertension 2. Hyperlipidemia 3. Right bundle branch block 4. Dyspnea on exertion  Chief Complaint  Patient presents with   Hypertension        Shortness of Breath    Previous notes   Anthony Allison is a 87 y.o. male with a hx of  HTN, hyperlipidemia ,  I saw Anthony Allison many years ago He recently went to a class reunion and talked to a classmate who had had a heart attack. He became concerned about some of his DOE Denies any CP .   Has some dyspnea when he mows the lawn - 45 min - 1 hr.  Push mower weed eats, blows grass .   Does not have to stop .   Previous pipe smoker - 20 years ago   Jun 27, 2020: Anthony Allison is seen today for follow up of his HTN, HLD. Has had DOE with very vigorous activity in the past.   Has seemed to be more physiologic than a true issue  He was seen remotely in 2020 due to Covid  Continues to have short winded.   Gets winded when he is out working in the garden  We have done a GXT in the past  - was normal by his memory    August 29, 2021 No show:   Aug. 23, 2023 Anthony Allison is seen today for follow up of his HTN, HLD Hs DOE with vigorous activity  Like Lexiscan Myoview study from August 27, 2020 was low risk with no evidence of ischemia.  GXT was nondiagnostic.  He walked on a standard Bruce protocol treadmill test for 8 minutes.  He achieved a peak heart rate of 96 which was only 70% of predicted maximal heart rate.  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   He has disucssed this dyspnea with Dr. Waynard Edwards His dyspnea    Aug. 30, 2024 Anthony Allison is seen for follow up of hi HTN, HLD  Exercises in the winter, Works in the yard in the spring ,  summer, fall Keeps a garden   No CP , no dysnea   Very rare episodes of CP  Just a very mild dull ache, Might last 30 seconds    Past Medical History:  Diagnosis Date   Abnormal finding on EKG    right bundle branch block, left axis deviation 10-16-2022 ekg   Colon polyps    ADENOMA   Erectile dysfunction of organic origin    Glaucoma both eyes    Hemorrhoids 2006   S/P PROCEDURE   Hyperlipidemia 2001   Hypertension 1999   ON MEDS   Phimosis    Prostatic hypertrophy    HX OF, BENIGN   Shingles 2006   ON BACK   Vitamin D deficiency     Past Surgical History:  Procedure Laterality Date   CIRCUMCISION N/A 07/21/2022   Procedure: CIRCUMCISION ADULT;  Surgeon: Belva Agee, MD;  [facility];  Service: Urology;  Laterality: N/A;   EYE SURGERY Bilateral    CATARACTS REMOVED 2015 OR 2016   GLAUCOMA SURGERY  2015   TONSILLECTOMY     as child  Current Medications: Current Meds  Medication Sig   amLODipine (NORVASC) 5 MG tablet Take 5 mg by mouth daily at 6 (six) AM.   Ascorbic Acid (VITAMIN C) 1000 MG tablet Take 1,000 mg by mouth daily.   finasteride (PROSCAR) 5 MG tablet Take 5 mg by mouth daily.   galantamine (RAZADYNE ER) 16 MG 24 hr capsule Take 16 mg by mouth daily.   GNP VITAMIN D3 EXTRA STRENGTH 25 MCG (1000 UT) tablet Take 1,000 Units by mouth daily.   ibandronate (BONIVA) 150 MG tablet Take 150 mg by mouth every 30 (thirty) days.   lisinopril (PRINIVIL,ZESTRIL) 40 MG tablet Take 40 mg by mouth daily.   MYRBETRIQ 50 MG TB24 tablet Take 50 mg by mouth daily.   Omega-3 Fatty Acids (FISH OIL) 1000 MG CAPS Take 2 capsules by mouth daily.   potassium chloride (MICRO-K) 10 MEQ CR capsule Take by mouth.   simvastatin (ZOCOR) 20 MG tablet SMARTSIG:1 Tablet(s) By Mouth Every Evening   traMADol (ULTRAM) 50 MG tablet Take 1 tablet (50 mg total) by mouth every 6 (six) hours as needed.   Travoprost, BAK Free, (TRAVATAN) 0.004 % SOLN ophthalmic  solution Place 1 drop into both eyes at bedtime.    Vibegron (GEMTESA) 75 MG TABS Take by mouth.   vitamin B-12 (CYANOCOBALAMIN) 1000 MCG tablet Take 1,000 mcg by mouth daily.     Allergies:   Patient has no known allergies.   Social History   Socioeconomic History   Marital status: Married    Spouse name: Not on file   Number of children: 3   Years of education: Not on file   Highest education level: Not on file  Occupational History   Occupation: ARCHITECT  Tobacco Use   Smoking status: Former    Types: Pipe   Smokeless tobacco: Never   Tobacco comments:    Quit cigars 40 yrs ago  Vaping Use   Vaping status: Never Used  Substance and Sexual Activity   Alcohol use: Not Currently   Drug use: No   Sexual activity: Not on file  Other Topics Concern   Not on file  Social History Narrative   Not on file   Social Determinants of Health   Financial Resource Strain: Not on file  Food Insecurity: Not on file  Transportation Needs: Not on file  Physical Activity: Not on file  Stress: Not on file  Social Connections: Not on file     Family History: The patient's family history includes CAD in his father; CVA in his mother; Stroke in his mother. ROS:   Please see the history of present illness.     All other systems reviewed and are negative.  EKGs/Labs/Other Studies Reviewed:    The following studies were reviewed today:     Recent Labs: 07/21/2022: BUN 19; Creatinine, Ser 0.90; Hemoglobin 15.6; Potassium 3.9; Sodium 143  Recent Lipid Panel No results found for: "CHOL", "TRIG", "HDL", "CHOLHDL", "VLDL", "LDLCALC", "LDLDIRECT"  Physical Exam:     Physical Exam: Blood pressure 112/70, pulse 61, height 5\' 7"  (1.702 m), weight 148 lb 12.8 oz (67.5 kg), SpO2 96%.       GEN:  Well nourished, well developed in no acute distress HEENT: Normal NECK: No JVD; No carotid bruits LYMPHATICS: No lymphadenopathy CARDIAC: RRR , no murmurs, rubs, gallops RESPIRATORY:   Clear to auscultation without rales, wheezing or rhonchi  ABDOMEN: Soft, non-tender, non-distended MUSCULOSKELETAL:  No edema; No deformity  SKIN: Warm and dry NEUROLOGIC:  Alert and oriented x 3    EKG:           ASSESSMENT:    1. Essential hypertension   2. Mixed hyperlipidemia   3. Chest pressure      PLAN:     1. Hypertension -    BP is well controlled.   2. Hyperlipidemia -  managed by Dr. Delena Serve   3. Right bundle branch block -     stable    4.  Mild chest pressure: Anthony Allison has started noticing some mild chest pressure.  These occur fairly rarely through the day.  They do not seem to be associated with exercise or eating or drinking.  They last for perhaps 30 seconds.  At this point I am not inclined to do a significant workup but I have asked him to let me know if these episodes start to become more severe, more frequent, or last longer.  I will see him again in 6 months for repeat evaluation.      Medication Adjustments/Labs and Tests Ordered: Current medicines are reviewed at length with the patient today.  Concerns regarding medicines are outlined above.  No orders of the defined types were placed in this encounter.  No orders of the defined types were placed in this encounter.    Signed, Kristeen Miss, MD  10/23/2022 1:54 PM    [facility]

## 2022-10-23 ENCOUNTER — Encounter: Payer: Self-pay | Admitting: Cardiovascular Disease

## 2022-10-23 ENCOUNTER — Ambulatory Visit: Payer: Medicare HMO | Attending: Cardiovascular Disease | Admitting: Cardiovascular Disease

## 2022-10-23 ENCOUNTER — Other Ambulatory Visit (INDEPENDENT_AMBULATORY_CARE_PROVIDER_SITE_OTHER): Payer: Medicare HMO

## 2022-10-23 VITALS — BP 112/70 | HR 61 | Ht 67.0 in | Wt 148.8 lb

## 2022-10-23 DIAGNOSIS — E782 Mixed hyperlipidemia: Secondary | ICD-10-CM

## 2022-10-23 DIAGNOSIS — I440 Atrioventricular block, first degree: Secondary | ICD-10-CM | POA: Diagnosis not present

## 2022-10-23 DIAGNOSIS — R0789 Other chest pain: Secondary | ICD-10-CM

## 2022-10-23 DIAGNOSIS — I4510 Unspecified right bundle-branch block: Secondary | ICD-10-CM | POA: Diagnosis not present

## 2022-10-23 DIAGNOSIS — I10 Essential (primary) hypertension: Secondary | ICD-10-CM

## 2022-10-23 NOTE — Patient Instructions (Signed)
Medication Instructions:  Your physician recommends that you continue on your current medications as directed. Please refer to the Current Medication list given to you today. *If you need a refill on your cardiac medications before your next appointment, please call your pharmacy*   Follow-Up: At [facility], you and your health needs are our priority.  As part of our continuing mission to provide you with exceptional heart care, we have created designated Provider Care Teams.  These Care Teams include your primary Cardiologist (physician) and Advanced Practice Providers (APPs -  Physician Assistants and Nurse Practitioners) who all work together to provide you with the care you need, when you need it.  We recommend signing up for the patient portal called "MyChart".  Sign up information is provided on this After Visit Summary.  MyChart is used to connect with patients for Virtual Visits (Telemedicine).  Patients are able to view lab/test results, encounter notes, upcoming appointments, etc.  Non-urgent messages can be sent to your provider as well.   To learn more about what you can do with MyChart, go to [email or web address].    Your next appointment:   6 month(s)  Provider:   Mertie Moores, MD

## 2022-11-17 DIAGNOSIS — E538 Deficiency of other specified B group vitamins: Secondary | ICD-10-CM | POA: Diagnosis not present

## 2022-11-17 DIAGNOSIS — N401 Enlarged prostate with lower urinary tract symptoms: Secondary | ICD-10-CM | POA: Diagnosis not present

## 2022-11-17 DIAGNOSIS — E291 Testicular hypofunction: Secondary | ICD-10-CM | POA: Diagnosis not present

## 2022-11-17 DIAGNOSIS — E785 Hyperlipidemia, unspecified: Secondary | ICD-10-CM | POA: Diagnosis not present

## 2022-11-17 DIAGNOSIS — I10 Essential (primary) hypertension: Secondary | ICD-10-CM | POA: Diagnosis not present

## 2022-11-17 DIAGNOSIS — Z1389 Encounter for screening for other disorder: Secondary | ICD-10-CM | POA: Diagnosis not present

## 2022-11-17 DIAGNOSIS — E7849 Other hyperlipidemia: Secondary | ICD-10-CM | POA: Diagnosis not present

## 2022-11-17 DIAGNOSIS — R7301 Impaired fasting glucose: Secondary | ICD-10-CM | POA: Diagnosis not present

## 2022-11-17 DIAGNOSIS — E1169 Type 2 diabetes mellitus with other specified complication: Secondary | ICD-10-CM | POA: Diagnosis not present

## 2022-11-24 DIAGNOSIS — I10 Essential (primary) hypertension: Secondary | ICD-10-CM | POA: Diagnosis not present

## 2022-11-24 DIAGNOSIS — R413 Other amnesia: Secondary | ICD-10-CM | POA: Diagnosis not present

## 2022-11-24 DIAGNOSIS — E1169 Type 2 diabetes mellitus with other specified complication: Secondary | ICD-10-CM | POA: Diagnosis not present

## 2022-11-24 DIAGNOSIS — E785 Hyperlipidemia, unspecified: Secondary | ICD-10-CM | POA: Diagnosis not present

## 2022-11-24 DIAGNOSIS — M8580 Other specified disorders of bone density and structure, unspecified site: Secondary | ICD-10-CM | POA: Diagnosis not present

## 2022-11-24 DIAGNOSIS — Z0000 Encounter for general adult medical examination without abnormal findings: Secondary | ICD-10-CM | POA: Diagnosis not present

## 2022-11-24 DIAGNOSIS — R82998 Other abnormal findings in urine: Secondary | ICD-10-CM | POA: Diagnosis not present

## 2022-11-24 DIAGNOSIS — Z23 Encounter for immunization: Secondary | ICD-10-CM | POA: Diagnosis not present

## 2022-11-24 DIAGNOSIS — N3281 Overactive bladder: Secondary | ICD-10-CM | POA: Diagnosis not present

## 2023-01-05 DIAGNOSIS — N3941 Urge incontinence: Secondary | ICD-10-CM | POA: Diagnosis not present

## 2023-02-04 ENCOUNTER — Telehealth: Payer: Self-pay | Admitting: Cardiovascular Disease

## 2023-02-04 NOTE — Telephone Encounter (Signed)
Pt c/o of Chest Pain: STAT if CP now or developed within 24 hours  1. Are you having CP right now? No, but the last time he felt it was last night   2. Are you experiencing any other symptoms (ex. SOB, nausea, vomiting, sweating)? No  3. How long have you been experiencing CP? A while now but it was less intense.   4. Is your CP continuous or coming and going? Coming and going   5. Have you taken Nitroglycerin? No  ?

## 2023-02-04 NOTE — Telephone Encounter (Signed)
Stat call transferred from operator, patient states he has been having brief episodes of chest pain for a year and wants to see Dr. Elease Hashimoto. Patient last saw Dr. Elease Hashimoto 10/23/22 and c/o brief epidsodes of chest pain/pressure at taht time, he states he is concerned now because he has been exerting himself more on a daily basis as he is in the process of selling a home and has noticed that he has been having "maybe 2 episodes" a week of chest pain that resolves quickly ("it lasts just a few seconds") but notes that episodes are occurring at rest and with exertion. He denies any SOB with these episodes. He reports he last checked his BP at home on 01/05/23 and it was 113/67 and HR was 54.   Paitent also notes he has chronic swelling of his left leg that his PCP says is from amlodipine that PCP prescribed. He says leg swelling is mild and resolves with elevation and compression stockings.   Made appt w/ DOD on 02/08/23. Discussed ED precautions, advised patient to limit exertion if possible but call 911 if chest pain or SOB escalate and do not resolve with rest. Patient verbalizes understanding.

## 2023-02-11 ENCOUNTER — Telehealth: Payer: Self-pay | Admitting: Cardiology

## 2023-02-11 NOTE — Telephone Encounter (Signed)
Husband called in with bp info. Please advise  11/16 116/60 HR 53 11/17 128/66 HR 58 11/18 132/68 HR 55 11/19 117/63 HR 56 11/20 123/58 HR 59 11/22 114/64 HR 60 11/25 118/66 HR 62 11/26 121/68 HR 59 11/28 128/63 HR 65 11/29 138/74 HR 58  11/30 118/55 HR 57 12/02 109/60 HR 56 12/03 131/59 HR 54 12/04 123/63 HR 56  12/05 114/67 HR 62 12/06 94/58 HR 64 12/07 116/65 HR 57 12/08 114/58 HR 54 12/09 133/72 HR 54 12/12 113/65 HR 61 12/15 125/59 HR 60 12/18 119/66 HR 55

## 2023-02-12 ENCOUNTER — Ambulatory Visit: Payer: Medicare HMO | Attending: Cardiology | Admitting: Cardiology

## 2023-02-12 ENCOUNTER — Encounter: Payer: Self-pay | Admitting: Cardiology

## 2023-02-12 VITALS — BP 110/78 | HR 53 | Ht 67.0 in | Wt 144.8 lb

## 2023-02-12 DIAGNOSIS — R0789 Other chest pain: Secondary | ICD-10-CM | POA: Diagnosis not present

## 2023-02-12 DIAGNOSIS — E782 Mixed hyperlipidemia: Secondary | ICD-10-CM

## 2023-02-12 DIAGNOSIS — I10 Essential (primary) hypertension: Secondary | ICD-10-CM | POA: Diagnosis not present

## 2023-02-12 NOTE — Progress Notes (Signed)
Cardiology Office Note:  .   Date:  02/12/2023  ID:  Anthony Allison, DOB 1935-12-01, MRN [id number] PCP: Rodrigo Ran, MD  [facility] Providers Cardiologist:  Kristeen Miss, MD     History of Present Illness: Anthony Allison   Anthony Allison is a 87 y.o. male Discussed with the use of AI scribe   History of Present Illness   The patient, an 87 year old male with a history of hypertension, hyperlipidemia, and chronic left leg edema, presents with intermittent episodes of chest pain and pressure. These episodes, occurring approximately twice a week, are brief, lasting only a few seconds, and occur both at rest and with exertion. The patient denies any significant shortness of breath associated with these episodes. The patient's blood pressure has been regularly monitored, with recent readings being within normal limits, such as 113/67.  The patient also reports chronic swelling in the left leg, which has been attributed to amlodipine by his primary care physician. The swelling resolves with elevation and compression. A lower extremity scan conducted in March 2024 due to the edema showed no evidence of deep vein thrombosis.  In July 2022, the patient underwent a myocardial perfusion scan, which was low risk with apical thinning, a normal variant. The patient's EKG shows sinus bradycardia at 53 beats per minute with PACs and right bundle branch block with an inferior infarct pattern.  The patient attributes the recent chest pain episodes to stress related to selling a house. The patient reports that these symptoms have not recurred since the house was sold. The patient's current medications include amlodipine 5mg  daily, lisinopril 40mg  daily, and simvastatin 20mg  for cholesterol management. The patient's recent labs, including LDL cholesterol and hemoglobin A1c, are within normal limits.           Studies Reviewed: Anthony Allison   EKG Interpretation Date/Time:  Friday February 12 2023 11:39:02  EST Ventricular Rate:  53 PR Interval:  206 QRS Duration:  154 QT Interval:  480 QTC Calculation: 450 R Axis:   -83  Text Interpretation: Sinus bradycardia with PAC's Left axis deviation Right bundle branch block Inferior infarct (cited on or before 23-Oct-2022) When compared with ECG of 23-Oct-2022 13:36, No significant change was found Confirmed by Donato Schultz ([id number]) on 02/12/2023 11:44:30 AM    Results   LABS LDL cholesterol: 68 (11/17/2022) HbA1c: 5.6 Hb: 13.8 Cr: 0.9  RADIOLOGY Lower extremity scan: No evidence of deep vein thrombosis (05/13/2022)  DIAGNOSTIC Myocardial perfusion scan: Low risk with apical thinning, normal variant (08/27/2020) EKG: Sinus bradycardia 53 bpm with premature atrial contractions (PACs) and right bundle branch block with inferior infarct pattern (02/12/2023)     Risk Assessment/Calculations:            Physical Exam:   VS:  BP 110/78   Pulse (!) 53   Ht 5\' 7"  (1.702 m)   Wt 144 lb 12.8 oz (65.7 kg)   SpO2 96%   BMI 22.68 kg/m    Wt Readings from Last 3 Encounters:  02/12/23 144 lb 12.8 oz (65.7 kg)  10/23/22 148 lb 12.8 oz (67.5 kg)  07/21/22 146 lb 1.6 oz (66.3 kg)    GEN: Well nourished, well developed in no acute distress NECK: No JVD; No carotid bruits CARDIAC: RRR, no murmurs, no rubs, no gallops RESPIRATORY:  Clear to auscultation without rales, wheezing or rhonchi  ABDOMEN: Soft, non-tender, non-distended EXTREMITIES:  No edema; No deformity   ASSESSMENT AND PLAN: .    Assessment and Plan  Chest Pain Intermittent chest pain and pressure occurring approximately twice a week, lasting a few seconds, both at rest and with exertion. No significant shortness of breath. EKG shows sinus bradycardia with PACs and right bundle branch block with inferior infarct pattern. Previous myocardial perfusion scan in 2022 was low risk with apical thinning, a normal variant. Current EKG unchanged from previous. Symptoms appear stress-related  and have resolved since the stressor (selling a house) was removed. Given the recent low-risk stress test and resolution of symptoms, no further immediate testing is warranted. - Monitor symptoms - Advise to reach out if symptoms worsen or become more frequent  Hypertension Well-controlled on current medications. Blood pressure readings are within normal range (e.g., 113/67). - Continue amlodipine 5 mg daily, lisinopril 40 mg daily - Monitor blood pressure regularly  Hyperlipidemia Well-controlled on current medication. LDL cholesterol was 68 mg/dL on 13/09/6576. - Continue simvastatin 20 mg daily - Monitor lipid levels regularly  Left Leg Edema Chronic swelling of the left leg, likely secondary to amlodipine use. Symptoms improve with elevation and compression. Lower extremity scan on 05/13/2022 showed no evidence of deep vein thrombosis. - Continue management with elevation and compression - Monitor for any changes in symptoms  General Health Maintenance Overall good health with recent lab results showing hemoglobin A1c of 5.6%, hemoglobin 13.8 g/dL, and creatinine 0.9 mg/dL. - Continue regular follow-ups and lab monitoring as needed  Follow-up - Contact clinic if symptoms worsen or new symptoms arise - Provide summary page.              Signed, Donato Schultz, MD

## 2023-02-12 NOTE — Patient Instructions (Signed)
Medication Instructions:  The current medical regimen is effective;  continue present plan and medications.  *If you need a refill on your cardiac medications before your next appointment, please call your pharmacy*  Follow-Up: At [facility], you and your health needs are our priority.  As part of our continuing mission to provide you with exceptional heart care, we have created designated Provider Care Teams.  These Care Teams include your primary Cardiologist (physician) and Advanced Practice Providers (APPs -  Physician Assistants and Nurse Practitioners) who all work together to provide you with the care you need, when you need it.  We recommend signing up for the patient portal called "MyChart".  Sign up information is provided on this After Visit Summary.  MyChart is used to connect with patients for Virtual Visits (Telemedicine).  Patients are able to view lab/test results, encounter notes, upcoming appointments, etc.  Non-urgent messages can be sent to your provider as well.   To learn more about what you can do with MyChart, go to [email or web address].    Your next appointment:   Follow up as needed.  

## 2023-02-15 DIAGNOSIS — Z961 Presence of intraocular lens: Secondary | ICD-10-CM | POA: Diagnosis not present

## 2023-02-15 DIAGNOSIS — H401131 Primary open-angle glaucoma, bilateral, mild stage: Secondary | ICD-10-CM | POA: Diagnosis not present

## 2023-02-16 NOTE — Telephone Encounter (Signed)
  Excellent. Will forward BP's to Dr. Waynard Edwards his PCP as well  The above documentation is fro Dr Donato Schultz.  Phone note forwarded to Dr Waynard Edwards as instructed.

## 2023-04-20 DIAGNOSIS — H938X2 Other specified disorders of left ear: Secondary | ICD-10-CM | POA: Diagnosis not present

## 2023-04-20 DIAGNOSIS — H6992 Unspecified Eustachian tube disorder, left ear: Secondary | ICD-10-CM | POA: Diagnosis not present

## 2023-05-18 ENCOUNTER — Ambulatory Visit: Attending: Cardiovascular Disease | Admitting: Cardiovascular Disease

## 2023-05-18 ENCOUNTER — Encounter: Payer: Self-pay | Admitting: Cardiovascular Disease

## 2023-05-18 VITALS — BP 116/72 | HR 52 | Ht 67.0 in | Wt 146.6 lb

## 2023-05-18 DIAGNOSIS — R0609 Other forms of dyspnea: Secondary | ICD-10-CM | POA: Diagnosis not present

## 2023-05-18 DIAGNOSIS — I10 Essential (primary) hypertension: Secondary | ICD-10-CM | POA: Diagnosis not present

## 2023-05-18 DIAGNOSIS — R0789 Other chest pain: Secondary | ICD-10-CM

## 2023-05-18 NOTE — Patient Instructions (Signed)
 Follow-Up: At [facility], you and your health needs are our priority.  As part of our continuing mission to provide you with exceptional heart care, we have created designated Provider Care Teams.  These Care Teams include your primary Cardiologist (physician) and Advanced Practice Providers (APPs -  Physician Assistants and Nurse Practitioners) who all work together to provide you with the care you need, when you need it.  Your next appointment:   1 year(s)  Provider:   Kristeen Miss, MD  Other Instructions   1st Floor: - Lobby - Registration  - Pharmacy  - Lab - Cafe  2nd Floor: - PV Lab - Diagnostic Testing (echo, CT, nuclear med)  3rd Floor: - Vacant  4th Floor: - TCTS (cardiothoracic surgery) - AFib Clinic - Structural Heart Clinic - Vascular Surgery  - Vascular Ultrasound  5th Floor: - HeartCare Cardiology (general and EP) - Clinical Pharmacy for coumadin, hypertension, lipid, weight-loss medications, and med management appointments    Valet parking services will be available as well.

## 2023-05-18 NOTE — Progress Notes (Signed)
 Cardiology Office Note:    Date:  05/18/2023   ID:  Anthony Allison, DOB February 06, 1936, MRN [id number]  PCP:  Rodrigo Ran, MD  Cardiologist:  Kristeen Miss, MD    Referring MD: Rodrigo Ran, MD   Problem list 1. Hypertension 2. Hyperlipidemia 3. Right bundle branch block 4. Dyspnea on exertion  Chief Complaint  Patient presents with   Hypertension        Shortness of Breath         Previous notes   Anthony Allison is a 88 y.o. male with a hx of  HTN, hyperlipidemia ,  I saw Anthony Allison many years ago He recently went to a class reunion and talked to a classmate who had had a heart attack. He became concerned about some of his DOE Denies any CP .   Has some dyspnea when he mows the lawn - 45 min - 1 hr.  Push mower weed eats, blows grass .   Does not have to stop .   Previous pipe smoker - 20 years ago   Jun 27, 2020: Anthony Allison is seen today for follow up of his HTN, HLD. Has had DOE with very vigorous activity in the past.   Has seemed to be more physiologic than a true issue  He was seen remotely in 2020 due to Covid  Continues to have short winded.   Gets winded when he is out working in the garden  We have done a GXT in the past  - was normal by his memory    August 29, 2021 No show:   Aug. 23, 2023 Anthony Allison is seen today for follow up of his HTN, HLD Hs DOE with vigorous activity  Like Lexiscan Myoview study from August 27, 2020 was low risk with no evidence of ischemia.  GXT was nondiagnostic.  He walked on a standard Bruce protocol treadmill test for 8 minutes.  He achieved a peak heart rate of 96 which was only 70% of predicted maximal heart rate.  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   He has disucssed this dyspnea with Dr. Waynard Edwards His dyspnea    Aug. 30, 2024 Anthony Allison is seen for follow up of hi HTN, HLD  Exercises in the winter, Works in the yard in the spring  , summer, fall Keeps a garden   No CP , no dysnea   Very rare episodes of CP  Just a very mild dull ache, Might last 30 seconds   May 18, 2023  Doing well,   Staying active  No chest pain    Past Medical History:  Diagnosis Date   Abnormal finding on EKG    right bundle branch block, left axis deviation 10-16-2022 ekg   Colon polyps    ADENOMA   Erectile dysfunction of organic origin    Glaucoma both eyes    Hemorrhoids 2006   S/P PROCEDURE   Hyperlipidemia 2001   Hypertension 1999   ON MEDS   Phimosis    Prostatic hypertrophy    HX OF, BENIGN   Shingles 2006   ON BACK   Vitamin D deficiency     Past Surgical History:  Procedure Laterality Date   CIRCUMCISION N/A 07/21/2022   Procedure: CIRCUMCISION ADULT;  Surgeon: Belva Agee, MD;  Location: [facility];  Service: Urology;  Laterality: N/A;   EYE SURGERY Bilateral  CATARACTS REMOVED 2015 OR 2016   GLAUCOMA SURGERY  2015   TONSILLECTOMY     as child    Current Medications: Current Meds  Medication Sig   amLODipine (NORVASC) 5 MG tablet Take 5 mg by mouth daily at 6 (six) AM.   Ascorbic Acid (VITAMIN C) 1000 MG tablet Take 1,000 mg by mouth daily.   finasteride (PROSCAR) 5 MG tablet Take 5 mg by mouth daily.   galantamine (RAZADYNE ER) 16 MG 24 hr capsule Take 16 mg by mouth daily.   GNP VITAMIN D3 EXTRA STRENGTH 25 MCG (1000 UT) tablet Take 1,000 Units by mouth daily.   ibandronate (BONIVA) 150 MG tablet Take 150 mg by mouth every 30 (thirty) days.   latanoprost (XALATAN) 0.005 % ophthalmic solution Place 1 drop into both eyes at bedtime.   lisinopril (PRINIVIL,ZESTRIL) 40 MG tablet Take 40 mg by mouth daily.   MYRBETRIQ 50 MG TB24 tablet Take 50 mg by mouth daily.   potassium chloride (MICRO-K) 10 MEQ CR capsule Take by mouth.   simvastatin (ZOCOR) 20 MG tablet SMARTSIG:1 Tablet(s) By Mouth Every Evening   traMADol (ULTRAM) 50 MG tablet Take 1 tablet (50 mg total) by mouth every 6  (six) hours as needed.   Vibegron (GEMTESA) 75 MG TABS Take by mouth.   vitamin B-12 (CYANOCOBALAMIN) 1000 MCG tablet Take 1,000 mcg by mouth daily.     Allergies:   Patient has no known allergies.   Social History   Socioeconomic History   Marital status: Married    Spouse name: Not on file   Number of children: 3   Years of education: Not on file   Highest education level: Not on file  Occupational History   Occupation: ARCHITECT  Tobacco Use   Smoking status: Former    Types: Pipe   Smokeless tobacco: Never   Tobacco comments:    Quit cigars 40 yrs ago  Vaping Use   Vaping status: Never Used  Substance and Sexual Activity   Alcohol use: Not Currently   Drug use: No   Sexual activity: Not on file  Other Topics Concern   Not on file  Social History Narrative   Not on file   Social Drivers of Health   Financial Resource Strain: Not on file  Food Insecurity: Not on file  Transportation Needs: Not on file  Physical Activity: Not on file  Stress: Not on file  Social Connections: Not on file     Family History: The patient's family history includes CAD in his father; CVA in his mother; Stroke in his mother. ROS:   Please see the history of present illness.     All other systems reviewed and are negative.  EKGs/Labs/Other Studies Reviewed:    The following studies were reviewed today:     Recent Labs: 07/21/2022: BUN 19; Creatinine, Ser 0.90; Hemoglobin 15.6; Potassium 3.9; Sodium 143  Recent Lipid Panel No results found for: "CHOL", "TRIG", "HDL", "CHOLHDL", "VLDL", "LDLCALC", "LDLDIRECT"  Physical Exam:     Physical Exam: Blood pressure 116/72, pulse (!) 52, height 5\' 7"  (1.702 m), weight 146 lb 9.6 oz (66.5 kg), SpO2 97%.       GEN:  Well nourished, well developed in no acute distress HEENT: Normal NECK: No JVD; No carotid bruits LYMPHATICS: No lymphadenopathy CARDIAC: RRR , no murmurs, rubs, gallops RESPIRATORY:  Clear to auscultation without  rales, wheezing or rhonchi  ABDOMEN: Soft, non-tender, non-distended MUSCULOSKELETAL:  No edema; No deformity  SKIN:  Warm and dry NEUROLOGIC:  Alert and oriented x 3  EKG:           ASSESSMENT:    No diagnosis found.    PLAN:     1. Hypertension -      blood pressure is well-controlled.  Continue current medications.  2. Hyperlipidemia -last lipid levels look good.  His last LDL was 68 on November 17, 2022  3. Right bundle branch block -      Stable.  4.  Mild chest pressure: He is no longer had any further episodes of chest discomfort.      Medication Adjustments/Labs and Tests Ordered: Current medicines are reviewed at length with the patient today.  Concerns regarding medicines are outlined above.  No orders of the defined types were placed in this encounter.  No orders of the defined types were placed in this encounter.    Signed, Kristeen Miss, MD  05/18/2023 1:53 PM    [facility]

## 2023-06-15 DIAGNOSIS — N3281 Overactive bladder: Secondary | ICD-10-CM | POA: Diagnosis not present

## 2023-06-15 DIAGNOSIS — R0609 Other forms of dyspnea: Secondary | ICD-10-CM | POA: Diagnosis not present

## 2023-06-15 DIAGNOSIS — R413 Other amnesia: Secondary | ICD-10-CM | POA: Diagnosis not present

## 2023-06-15 DIAGNOSIS — H6992 Unspecified Eustachian tube disorder, left ear: Secondary | ICD-10-CM | POA: Diagnosis not present

## 2023-06-15 DIAGNOSIS — I10 Essential (primary) hypertension: Secondary | ICD-10-CM | POA: Diagnosis not present

## 2023-06-15 DIAGNOSIS — E785 Hyperlipidemia, unspecified: Secondary | ICD-10-CM | POA: Diagnosis not present

## 2023-06-15 DIAGNOSIS — M8580 Other specified disorders of bone density and structure, unspecified site: Secondary | ICD-10-CM | POA: Diagnosis not present

## 2023-06-15 DIAGNOSIS — E1169 Type 2 diabetes mellitus with other specified complication: Secondary | ICD-10-CM | POA: Diagnosis not present

## 2023-07-02 DIAGNOSIS — H401331 Pigmentary glaucoma, bilateral, mild stage: Secondary | ICD-10-CM | POA: Diagnosis not present

## 2023-07-02 DIAGNOSIS — H26492 Other secondary cataract, left eye: Secondary | ICD-10-CM | POA: Diagnosis not present

## 2023-07-15 DIAGNOSIS — M8589 Other specified disorders of bone density and structure, multiple sites: Secondary | ICD-10-CM | POA: Diagnosis not present

## 2023-08-16 DIAGNOSIS — L821 Other seborrheic keratosis: Secondary | ICD-10-CM | POA: Diagnosis not present

## 2023-08-16 DIAGNOSIS — L570 Actinic keratosis: Secondary | ICD-10-CM | POA: Diagnosis not present

## 2023-08-16 DIAGNOSIS — C4442 Squamous cell carcinoma of skin of scalp and neck: Secondary | ICD-10-CM | POA: Diagnosis not present

## 2023-08-16 DIAGNOSIS — D1801 Hemangioma of skin and subcutaneous tissue: Secondary | ICD-10-CM | POA: Diagnosis not present

## 2023-08-16 DIAGNOSIS — L814 Other melanin hyperpigmentation: Secondary | ICD-10-CM | POA: Diagnosis not present

## 2023-08-16 DIAGNOSIS — Z85828 Personal history of other malignant neoplasm of skin: Secondary | ICD-10-CM | POA: Diagnosis not present

## 2023-11-04 DIAGNOSIS — H04123 Dry eye syndrome of bilateral lacrimal glands: Secondary | ICD-10-CM | POA: Diagnosis not present

## 2023-11-04 DIAGNOSIS — H401331 Pigmentary glaucoma, bilateral, mild stage: Secondary | ICD-10-CM | POA: Diagnosis not present

## 2023-11-22 DIAGNOSIS — E7849 Other hyperlipidemia: Secondary | ICD-10-CM | POA: Diagnosis not present

## 2023-11-22 DIAGNOSIS — E559 Vitamin D deficiency, unspecified: Secondary | ICD-10-CM | POA: Diagnosis not present

## 2023-11-22 DIAGNOSIS — D538 Other specified nutritional anemias: Secondary | ICD-10-CM | POA: Diagnosis not present

## 2023-11-22 DIAGNOSIS — Z0189 Encounter for other specified special examinations: Secondary | ICD-10-CM | POA: Diagnosis not present

## 2023-11-22 DIAGNOSIS — E291 Testicular hypofunction: Secondary | ICD-10-CM | POA: Diagnosis not present

## 2023-11-22 DIAGNOSIS — Z125 Encounter for screening for malignant neoplasm of prostate: Secondary | ICD-10-CM | POA: Diagnosis not present

## 2023-11-29 DIAGNOSIS — R413 Other amnesia: Secondary | ICD-10-CM | POA: Diagnosis not present

## 2023-11-29 DIAGNOSIS — Z1339 Encounter for screening examination for other mental health and behavioral disorders: Secondary | ICD-10-CM | POA: Diagnosis not present

## 2023-11-29 DIAGNOSIS — N401 Enlarged prostate with lower urinary tract symptoms: Secondary | ICD-10-CM | POA: Diagnosis not present

## 2023-11-29 DIAGNOSIS — Z1331 Encounter for screening for depression: Secondary | ICD-10-CM | POA: Diagnosis not present

## 2023-11-29 DIAGNOSIS — Z79899 Other long term (current) drug therapy: Secondary | ICD-10-CM | POA: Diagnosis not present

## 2023-11-29 DIAGNOSIS — C439 Malignant melanoma of skin, unspecified: Secondary | ICD-10-CM | POA: Diagnosis not present

## 2023-11-29 DIAGNOSIS — Z23 Encounter for immunization: Secondary | ICD-10-CM | POA: Diagnosis not present

## 2023-11-29 DIAGNOSIS — Z0000 Encounter for general adult medical examination without abnormal findings: Secondary | ICD-10-CM | POA: Diagnosis not present

## 2023-11-29 DIAGNOSIS — M8580 Other specified disorders of bone density and structure, unspecified site: Secondary | ICD-10-CM | POA: Diagnosis not present

## 2023-11-29 DIAGNOSIS — I10 Essential (primary) hypertension: Secondary | ICD-10-CM | POA: Diagnosis not present

## 2023-11-29 DIAGNOSIS — H269 Unspecified cataract: Secondary | ICD-10-CM | POA: Diagnosis not present

## 2023-11-29 DIAGNOSIS — E785 Hyperlipidemia, unspecified: Secondary | ICD-10-CM | POA: Diagnosis not present

## 2023-11-29 DIAGNOSIS — Z87438 Personal history of other diseases of male genital organs: Secondary | ICD-10-CM | POA: Diagnosis not present

## 2023-11-29 DIAGNOSIS — R82998 Other abnormal findings in urine: Secondary | ICD-10-CM | POA: Diagnosis not present

## 2023-11-29 DIAGNOSIS — E1169 Type 2 diabetes mellitus with other specified complication: Secondary | ICD-10-CM | POA: Diagnosis not present

## 2024-01-04 DIAGNOSIS — N3281 Overactive bladder: Secondary | ICD-10-CM | POA: Diagnosis not present

## 2024-01-04 DIAGNOSIS — R351 Nocturia: Secondary | ICD-10-CM | POA: Diagnosis not present

## 2024-01-04 DIAGNOSIS — R399 Unspecified symptoms and signs involving the genitourinary system: Secondary | ICD-10-CM | POA: Diagnosis not present

## 2024-01-04 DIAGNOSIS — R3915 Urgency of urination: Secondary | ICD-10-CM | POA: Diagnosis not present

## 2024-01-04 DIAGNOSIS — R3912 Poor urinary stream: Secondary | ICD-10-CM | POA: Diagnosis not present

## 2024-01-04 DIAGNOSIS — R350 Frequency of micturition: Secondary | ICD-10-CM | POA: Diagnosis not present

## 2024-01-04 DIAGNOSIS — N400 Enlarged prostate without lower urinary tract symptoms: Secondary | ICD-10-CM | POA: Diagnosis not present

## 2024-01-04 DIAGNOSIS — R3914 Feeling of incomplete bladder emptying: Secondary | ICD-10-CM | POA: Diagnosis not present

## 2024-01-04 DIAGNOSIS — N401 Enlarged prostate with lower urinary tract symptoms: Secondary | ICD-10-CM | POA: Diagnosis not present
